# Patient Record
Sex: Female | Born: 1955 | Race: Black or African American | Hispanic: No | Smoking: Never smoker
Health system: Southern US, Community
[De-identification: ages and names within clinical notes are randomized; demographics above are authoritative.]

## PROBLEM LIST (undated history)

## (undated) DIAGNOSIS — E785 Hyperlipidemia, unspecified: Secondary | ICD-10-CM

## (undated) DIAGNOSIS — I10 Essential (primary) hypertension: Secondary | ICD-10-CM

## (undated) DIAGNOSIS — N83209 Unspecified ovarian cyst, unspecified side: Secondary | ICD-10-CM

## (undated) DIAGNOSIS — N6009 Solitary cyst of unspecified breast: Secondary | ICD-10-CM

## (undated) DIAGNOSIS — B019 Varicella without complication: Secondary | ICD-10-CM

## (undated) HISTORY — DX: Varicella without complication: B01.9

## (undated) HISTORY — DX: Unspecified ovarian cyst, unspecified side: N83.209

## (undated) HISTORY — PX: BREAST CYST EXCISION: SHX579

## (undated) HISTORY — DX: Solitary cyst of unspecified breast: N60.09

## (undated) HISTORY — DX: Essential (primary) hypertension: I10

## (undated) HISTORY — DX: Hyperlipidemia, unspecified: E78.5

## (undated) HISTORY — PX: OVARIAN CYST SURGERY: SHX726

---

## 1983-06-25 HISTORY — PX: ABDOMINAL HYSTERECTOMY: SHX81

## 2013-11-25 ENCOUNTER — Encounter: Payer: Self-pay | Admitting: Family

## 2013-11-25 ENCOUNTER — Ambulatory Visit (INDEPENDENT_AMBULATORY_CARE_PROVIDER_SITE_OTHER): Payer: Self-pay | Admitting: Family

## 2013-11-25 VITALS — BP 124/82 | HR 66 | Temp 98.2°F | Resp 16 | Ht 61.0 in | Wt 202.8 lb

## 2013-11-25 DIAGNOSIS — Z0000 Encounter for general adult medical examination without abnormal findings: Secondary | ICD-10-CM

## 2013-11-25 DIAGNOSIS — E785 Hyperlipidemia, unspecified: Secondary | ICD-10-CM | POA: Insufficient documentation

## 2013-11-25 DIAGNOSIS — M545 Low back pain, unspecified: Secondary | ICD-10-CM | POA: Insufficient documentation

## 2013-11-25 DIAGNOSIS — I10 Essential (primary) hypertension: Secondary | ICD-10-CM

## 2013-11-25 MED ORDER — LOSARTAN POTASSIUM-HCTZ 100-25 MG PO TABS: 1.0000 | ORAL_TABLET | Freq: Every day | ORAL | Status: DC

## 2013-11-25 MED ORDER — AMLODIPINE BESYLATE 10 MG PO TABS: 10.0000 mg | ORAL_TABLET | Freq: Every day | ORAL | Status: DC

## 2013-11-25 MED ORDER — LOVASTATIN 20 MG PO TABS: 20.0000 mg | ORAL_TABLET | Freq: Every day | ORAL | Status: DC

## 2013-11-25 NOTE — Progress Notes (Signed)
Pre visit review using our clinic review tool, if applicable. No additional management support is needed unless otherwise documented below in the visit note/SLS  

## 2013-11-25 NOTE — Progress Notes (Signed)
Subjective:    Patient ID: Regina Turner, female    DOB: 06-Feb-1956, 58 y.o.   MRN: [id number]  HPI  Ms. Regina Turner is a 58 yr old female who presents today to establish care. [facility]  1) HTN- maintained on losartan-hctz.  BP Readings from Last 3 Encounters:  11/25/13 124/82  Has been treated since her 30's.  She denies CP/SOB/or swelling.    2) hyperlipidemia- currently maintained on mevacor.   She reports that her cholesterol is well controlled. Denies current myalgia.    3) Some low back pain worse with vacuuming.    Reports last cpx was in December.  Last tetanus shot? Last pap 2013.  Neg pap 4/13.   Review of Systems  Constitutional:       Weight has been up and down.   HENT: Negative for rhinorrhea.   Respiratory: Negative for shortness of breath.        Reports occasional cough which is relieved by "allergy pill."  Cardiovascular: Negative for chest pain.  Gastrointestinal: Negative for nausea, vomiting and diarrhea.  Genitourinary: Negative for dysuria, frequency and hematuria.  Musculoskeletal: Negative for myalgias.  Skin: Negative for rash.  Neurological: Negative for headaches.  Hematological: Negative for adenopathy.  Psychiatric/Behavioral:       Denies depression/anxiety   Past Medical History  Diagnosis Date  . Hypertension   . Hyperlipidemia   . Chicken pox   . Breast cyst   . Cyst of ovary     History   Social History  . Marital Status: Divorced    Spouse Name: N/A    Number of Children: N/A  . Years of Education: N/A   Occupational History  . Not on file.   Social History Main Topics  . Smoking status: Never Smoker   . Smokeless tobacco: Not on file  . Alcohol Use: Not on file  . Drug Use: Not on file  . Sexual Activity: Not on file   Other Topics Concern  . Not on file   Social History Narrative   Divorced   Has worked in school system- quit in 2013   Lives with daughter, [place] another [place]   Completed 12th grade   Enjoys reading    Past Surgical History  Procedure Laterality Date  . Ovarian cyst surgery    . Breast cyst excision    . Abdominal hysterectomy  1985    Family History  Problem Relation Age of Onset  . Kidney disease Mother 3368    Deceased  . Arthritis Mother   . Stroke Mother   . Hypertension Mother   . Emphysema Father     Deceased 5970s  . Heart attack Father   . Cancer Maternal Grandfather   . Alzheimer's disease Maternal Grandmother   . Cancer Paternal Uncle   . Parkinson's disease Sister   . Diabetes Sister   . Stroke Brother   . Hypertension Brother     x2  . Lupus Daughter   . Migraines Daughter   . Hypertension Daughter     x2    Allergies  Allergen Reactions  . Sulfa Antibiotics Nausea And Vomiting    No current outpatient prescriptions on file prior to visit.   No current facility-administered medications on file prior to visit.    BP 124/82  Pulse 66  Temp(Src) 98.2 F (36.8 C) (Oral)  Resp 16  Ht 5\' 1"  (1.549 m)  Wt 202 lb 12 oz (  91.967 kg)  BMI 38.33 kg/m2  SpO2 98%       Objective:   Physical Exam  Constitutional: She is oriented to person, place, and time. She appears well-developed and well-nourished. No distress.  Obese AA female  HENT:  Head: Normocephalic and atraumatic.  Cardiovascular: Normal rate and regular rhythm.   No murmur heard. Pulmonary/Chest: Effort normal and breath sounds normal. No respiratory distress. She has no wheezes. She has no rales. She exhibits no tenderness.  Musculoskeletal: She exhibits no edema.  Lymphadenopathy:    She has no cervical adenopathy.  Neurological: She is alert and oriented to person, place, and time. No cranial nerve deficit.  Skin: Skin is warm and dry.  Psychiatric: She has a normal mood and affect. Her behavior is normal. Judgment and thought content normal.          Assessment & Plan:

## 2013-11-25 NOTE — Assessment & Plan Note (Signed)
Tolerating statin, continue same, obtain FLP/LFT.

## 2013-11-25 NOTE — Patient Instructions (Signed)
Please complete lab work prior to leaving. Follow up in 4 months for physical, sooner if problems/concerns. You may take tylenol as needed for back pain.

## 2013-11-25 NOTE — Assessment & Plan Note (Signed)
BP stable on current meds. Continue same. Obtain follow up bmet.  

## 2013-11-25 NOTE — Assessment & Plan Note (Signed)
Discussed importance of weight loss. 

## 2013-11-26 ENCOUNTER — Telehealth: Payer: Self-pay | Admitting: Family

## 2013-11-26 NOTE — Telephone Encounter (Signed)
Relevant patient education assigned to patient using Emmi. ° °

## 2014-03-29 ENCOUNTER — Ambulatory Visit: Payer: Self-pay | Admitting: Family

## 2014-05-02 ENCOUNTER — Ambulatory Visit: Payer: Self-pay | Admitting: Family

## 2014-05-09 LAB — LIPID PANEL
CHOLESTEROL: 172 mg/dL (ref 0–200)
HDL: 54 mg/dL (ref >39)
LDL Cholesterol: 93 mg/dL (ref 0–99)
TRIGLYCERIDES: 124 mg/dL (ref <150)
Total CHOL/HDL Ratio: 3.2 Ratio
VLDL: 25 mg/dL (ref 0–40)

## 2014-05-09 LAB — BASIC METABOLIC PANEL
BUN: 14 mg/dL (ref 6–23)
CALCIUM: 10.3 mg/dL (ref 8.4–10.5)
CHLORIDE: 100 meq/L (ref 96–112)
CO2: 29 mEq/L (ref 19–32)
CREATININE: 0.78 mg/dL (ref 0.50–1.10)
Glucose, Bld: 107 mg/dL — ABNORMAL HIGH (ref 70–99)
Potassium: 3.5 mEq/L (ref 3.5–5.3)
Sodium: 141 mEq/L (ref 135–145)

## 2014-05-09 LAB — HEPATIC FUNCTION PANEL
ALBUMIN: 4.5 g/dL (ref 3.5–5.2)
ALT: 11 U/L (ref 0–35)
AST: 16 U/L (ref 0–37)
Alkaline Phosphatase: 64 U/L (ref 39–117)
Bilirubin, Direct: 0.1 mg/dL (ref 0.0–0.3)
Indirect Bilirubin: 0.3 mg/dL (ref 0.2–1.2)
TOTAL PROTEIN: 7.3 g/dL (ref 6.0–8.3)
Total Bilirubin: 0.4 mg/dL (ref 0.2–1.2)

## 2014-05-10 ENCOUNTER — Encounter: Payer: Self-pay | Admitting: Family

## 2014-05-13 ENCOUNTER — Encounter: Payer: Self-pay | Admitting: Family

## 2014-05-13 ENCOUNTER — Ambulatory Visit (INDEPENDENT_AMBULATORY_CARE_PROVIDER_SITE_OTHER): Payer: Self-pay | Admitting: Family

## 2014-05-13 VITALS — BP 124/52 | HR 69 | Temp 97.8°F | Resp 16 | Ht 61.0 in | Wt 205.6 lb

## 2014-05-13 DIAGNOSIS — R739 Hyperglycemia, unspecified: Secondary | ICD-10-CM

## 2014-05-13 DIAGNOSIS — I10 Essential (primary) hypertension: Secondary | ICD-10-CM

## 2014-05-13 DIAGNOSIS — E785 Hyperlipidemia, unspecified: Secondary | ICD-10-CM

## 2014-05-13 DIAGNOSIS — Z87898 Personal history of other specified conditions: Secondary | ICD-10-CM

## 2014-05-13 DIAGNOSIS — M545 Low back pain: Secondary | ICD-10-CM

## 2014-05-13 MED ORDER — LOVASTATIN 20 MG PO TABS: 20.0000 mg | ORAL_TABLET | Freq: Every day | ORAL | Status: DC

## 2014-05-13 MED ORDER — AMLODIPINE BESYLATE 10 MG PO TABS: 10.0000 mg | ORAL_TABLET | Freq: Every day | ORAL | Status: DC

## 2014-05-13 MED ORDER — LOSARTAN POTASSIUM-HCTZ 100-25 MG PO TABS: 1.0000 | ORAL_TABLET | Freq: Every day | ORAL | Status: DC

## 2014-05-13 NOTE — Patient Instructions (Addendum)
Please complete lab work at your earliest convenience. Use motrin sparingly as needed for back pain.   Continue current meds.

## 2014-05-13 NOTE — Progress Notes (Signed)
Subjective:    Patient ID: Regina Turner, female    DOB: 08-27-1955, 58 y.o.   MRN: [id number]  HPI  Regina Turner is a 58 yr old female who presents today for follow up.  1) HTN-  maintained on amlodipine, and hyzaar.  BP Readings from Last 3 Encounters:  05/13/14 124/52  11/25/13 124/82    2) Hyperlipidemia-  Maintained on mevacor.   Lab Results  Component Value Date   CHOL 172 05/09/2014   HDL 54 05/09/2014   LDLCALC 93 05/09/2014   TRIG 124 05/09/2014   CHOLHDL 3.2 05/09/2014   3) Low back pain- reports that this has been "off and on." Using motrin prn.   Pt reports that she has never been diagnosed with anemia but if she does not take iron she becomes "tired."   Review of Systems See HPI  Past Medical History  Diagnosis Date  . Hypertension   . Hyperlipidemia   . Chicken pox   . Breast cyst   . Cyst of ovary     History   Social History  . Marital Status: Divorced    Spouse Name: N/A    Number of Children: N/A  . Years of Education: N/A   Occupational History  . Not on file.   Social History Main Topics  . Smoking status: Never Smoker   . Smokeless tobacco: Not on file  . Alcohol Use: Not on file  . Drug Use: Not on file  . Sexual Activity: Not on file   Other Topics Concern  . Not on file   Social History Narrative   Divorced   Has worked in school system- quit in 2013   Lives with daughter, has another daughter [place] in [place]   Completed 12th grade   Enjoys reading    Past Surgical History  Procedure Laterality Date  . Ovarian cyst surgery    . Breast cyst excision    . Abdominal hysterectomy  1985    Family History  Problem Relation Age of Onset  . Kidney disease Mother 6968    Deceased  . Arthritis Mother   . Stroke Mother   . Hypertension Mother   . Emphysema Father     Deceased 1470s  . Heart attack Father   . Cancer Maternal Grandfather   . Alzheimer's disease Maternal Grandmother   . Cancer Paternal Uncle    . Parkinson's disease Sister   . Diabetes Sister   . Stroke Brother   . Hypertension Brother     x2  . Lupus Daughter   . Migraines Daughter   . Hypertension Daughter     x2    Allergies  Allergen Reactions  . Sulfa Antibiotics Nausea And Vomiting    Current Outpatient Prescriptions on File Prior to Visit  Medication Sig Dispense Refill  . amLODipine (NORVASC) 10 MG tablet Take 1 tablet (10 mg total) by mouth at bedtime. 90 tablet 1  . Cholecalciferol 1000 UNITS capsule Take 1,000 Units by mouth daily.    . Ferrous Sulfate (IRON) 325 (65 FE) MG TABS Take 1 tablet by mouth daily. OTC    . losartan-hydrochlorothiazide (HYZAAR) 100-25 MG per tablet Take 1 tablet by mouth daily. 90 tablet 1  . lovastatin (MEVACOR) 20 MG tablet Take 1 tablet (20 mg total) by mouth daily. 90 tablet 1  . Multiple Vitamins-Minerals (CENTRUM SILVER ADULT 50+ PO) Take 1 tablet by mouth daily.     No current facility-administered medications on file  prior to visit.    BP 124/52 mmHg  Pulse 69  Temp(Src) 97.8 F (36.6 C) (Oral)  Resp 16  Ht 5\' 1"  (1.549 m)  Wt 205 lb 9.6 oz (93.26 kg)  BMI 38.87 kg/m2  SpO2 100%       Objective:   Physical Exam  Constitutional: She is oriented to person, place, and time. She appears well-developed and well-nourished. No distress.  HENT:  Head: Normocephalic and atraumatic.  Cardiovascular: Normal rate and regular rhythm.   No murmur heard. Pulmonary/Chest: Effort normal and breath sounds normal. No respiratory distress. She has no wheezes. She has no rales. She exhibits no tenderness.  Musculoskeletal: She exhibits no edema.  Neurological: She is alert and oriented to person, place, and time.  Psychiatric: She has a normal mood and affect. Her behavior is normal. Judgment and thought content normal.          Assessment & Plan:

## 2014-05-13 NOTE — Assessment & Plan Note (Signed)
Will obtain A1C to rule out diabetes.  We did discuss flu shot and she declines.  Pt was given number to the business office to see if she can apply for [facility] pt assistance. We did discuss that she should schedule a complete physical at her earliest convenience.

## 2014-05-13 NOTE — Assessment & Plan Note (Signed)
Advised pt to make sure to only use ibuprofen sparingly.

## 2014-05-13 NOTE — Assessment & Plan Note (Signed)
Stable on mevacor, continue same.

## 2014-05-13 NOTE — Assessment & Plan Note (Addendum)
BP stable on current meds. Continue same.  

## 2014-05-13 NOTE — Progress Notes (Signed)
Pre visit review using our clinic review tool, if applicable. No additional management support is needed unless otherwise documented below in the visit note. 

## 2014-06-08 ENCOUNTER — Ambulatory Visit: Payer: Self-pay | Admitting: Medical

## 2014-11-04 ENCOUNTER — Other Ambulatory Visit: Payer: Self-pay | Admitting: Family

## 2014-11-04 NOTE — Telephone Encounter (Signed)
Lovastatin refill sent to pharmacy. Pt last seen 04/2014. When should pt follow up in the office?

## 2014-11-06 NOTE — Telephone Encounter (Signed)
Patient is due for follow up please arrange.

## 2014-11-07 NOTE — Telephone Encounter (Signed)
Informed patient daughter of med refill and she scheduled appointment for 12/02/14 for patient

## 2014-11-15 ENCOUNTER — Other Ambulatory Visit: Payer: Self-pay | Admitting: Family

## 2014-12-02 ENCOUNTER — Ambulatory Visit: Payer: Self-pay | Admitting: Family

## 2014-12-15 ENCOUNTER — Ambulatory Visit (INDEPENDENT_AMBULATORY_CARE_PROVIDER_SITE_OTHER): Payer: Self-pay | Admitting: Family

## 2014-12-15 ENCOUNTER — Encounter: Payer: Self-pay | Admitting: Family

## 2014-12-15 VITALS — BP 118/80 | HR 78 | Temp 98.2°F | Resp 18 | Ht 61.0 in | Wt 207.0 lb

## 2014-12-15 DIAGNOSIS — E785 Hyperlipidemia, unspecified: Secondary | ICD-10-CM

## 2014-12-15 DIAGNOSIS — D509 Iron deficiency anemia, unspecified: Secondary | ICD-10-CM

## 2014-12-15 DIAGNOSIS — M722 Plantar fascial fibromatosis: Secondary | ICD-10-CM

## 2014-12-15 DIAGNOSIS — I10 Essential (primary) hypertension: Secondary | ICD-10-CM

## 2014-12-15 DIAGNOSIS — D649 Anemia, unspecified: Secondary | ICD-10-CM

## 2014-12-15 DIAGNOSIS — R739 Hyperglycemia, unspecified: Secondary | ICD-10-CM

## 2014-12-15 LAB — CBC WITH DIFFERENTIAL/PLATELET
BASOS PCT: 0.5 % (ref 0.0–3.0)
Basophils Absolute: 0 10*3/uL (ref 0.0–0.1)
EOS PCT: 1 % (ref 0.0–5.0)
Eosinophils Absolute: 0.1 10*3/uL (ref 0.0–0.7)
HEMATOCRIT: 39.5 % (ref 36.0–46.0)
HEMOGLOBIN: 13 g/dL (ref 12.0–15.0)
LYMPHS PCT: 36.6 % (ref 12.0–46.0)
Lymphs Abs: 2.9 10*3/uL (ref 0.7–4.0)
MCHC: 32.9 g/dL (ref 30.0–36.0)
MCV: 86.3 fl (ref 78.0–100.0)
MONOS PCT: 6.5 % (ref 3.0–12.0)
Monocytes Absolute: 0.5 10*3/uL (ref 0.1–1.0)
NEUTROS PCT: 55.4 % (ref 43.0–77.0)
Neutro Abs: 4.4 10*3/uL (ref 1.4–7.7)
PLATELETS: 320 10*3/uL (ref 150.0–400.0)
RBC: 4.58 Mil/uL (ref 3.87–5.11)
RDW: 14.4 % (ref 11.5–15.5)
WBC: 7.9 10*3/uL (ref 4.0–10.5)

## 2014-12-15 LAB — BASIC METABOLIC PANEL
BUN: 20 mg/dL (ref 6–23)
CHLORIDE: 100 meq/L (ref 96–112)
CO2: 29 mEq/L (ref 19–32)
Calcium: 10.6 mg/dL — ABNORMAL HIGH (ref 8.4–10.5)
Creatinine, Ser: 1.04 mg/dL (ref 0.40–1.20)
GFR: 69.59 mL/min (ref >60.00)
Glucose, Bld: 91 mg/dL (ref 70–99)
Potassium: 3 mEq/L — ABNORMAL LOW (ref 3.5–5.1)
Sodium: 137 mEq/L (ref 135–145)

## 2014-12-15 LAB — IRON: Iron: 54 ug/dL (ref 42–145)

## 2014-12-15 LAB — HEMOGLOBIN A1C: Hgb A1c MFr Bld: 6 % (ref 4.6–6.5)

## 2014-12-15 MED ORDER — LOSARTAN POTASSIUM-HCTZ 100-25 MG PO TABS: 1.0000 | ORAL_TABLET | Freq: Every day | ORAL | Status: DC

## 2014-12-15 MED ORDER — LOVASTATIN 20 MG PO TABS: ORAL_TABLET | ORAL | Status: DC

## 2014-12-15 MED ORDER — AMLODIPINE BESYLATE 10 MG PO TABS: ORAL_TABLET | ORAL | Status: DC

## 2014-12-15 NOTE — Assessment & Plan Note (Signed)
Advised pt:  Heel pain is most likely due to plantar fasciitis.  You may use aleve 220mg  (one tab) twice daily for 1 week for right heel pain.  Ice foot twice daily by rolling on a frozen water bottle. Call if symptoms worsen or if symptoms do not improve.

## 2014-12-15 NOTE — Assessment & Plan Note (Signed)
Obtain A1C. 

## 2014-12-15 NOTE — Patient Instructions (Addendum)
Heel pain is most likely due to plantar fasciitis.  You may use aleve 220mg  (one tab) twice daily for 1 week for right heel pain.  Ice foot twice daily by rolling on a frozen water bottle. Call if symptoms worsen or if symptoms do not improve.  Complete lab work prior to leaving.

## 2014-12-15 NOTE — Assessment & Plan Note (Signed)
At goal on mevacor, continue same.

## 2014-12-15 NOTE — Progress Notes (Signed)
Pre visit review using our clinic review tool, if applicable. No additional management support is needed unless otherwise documented below in the visit note. 

## 2014-12-15 NOTE — Assessment & Plan Note (Signed)
Stable on current meds. Continue same. Obtain follow up bmet.

## 2014-12-15 NOTE — Progress Notes (Signed)
Subjective:    Patient ID: Regina Turner, female    DOB: 05-Aug-1955, 59 y.o.   MRN: [id number]  HPI  Regina Turner is a 59 yr old female who presents today for follow up.  1) Hypertension- maintained on hyzaar and amlodipine.  Denies CP or SOB.  BP Readings from Last 3 Encounters:  12/15/14 118/80  05/13/14 124/52  11/25/13 124/82    2) Anemia- continues the iron supplement. Did not complete blood work ordered last visit.    3) Hyperglycemia- last visit glucose was mildly elevated.   4) Right foot pain- occasionally.    5) Hyperlipidemia- on mevacor.  Denies myalgia.  Lab Results  Component Value Date   CHOL 172 05/09/2014   HDL 54 05/09/2014   LDLCALC 93 05/09/2014   TRIG 124 05/09/2014   CHOLHDL 3.2 05/09/2014    Review of Systems See HPI  Past Medical History  Diagnosis Date  . Hypertension   . Hyperlipidemia   . Chicken pox   . Breast cyst   . Cyst of ovary     History   Social History  . Marital Status: Divorced    Spouse Name: N/A  . Number of Children: N/A  . Years of Education: N/A   Occupational History  . Not on file.   Social History Main Topics  . Smoking status: Never Smoker   . Smokeless tobacco: Not on file  . Alcohol Use: Not on file  . Drug Use: Not on file  . Sexual Activity: Not on file   Other Topics Concern  . Not on file   Social History Narrative   Divorced   Has worked in school system- quit in 2013   Lives with daughter, has another daughter who lives in [place]   Completed 12th grade   Enjoys reading    Past Surgical History  Procedure Laterality Date  . Ovarian cyst surgery    . Breast cyst excision    . Abdominal hysterectomy  1985    Family History  Problem Relation Age of Onset  . Kidney disease Mother 58    Deceased  . Arthritis Mother   . Stroke Mother   . Hypertension Mother   . Emphysema Father     Deceased 25s  . Heart attack Father   . Cancer Maternal Grandfather   . Alzheimer's  disease Maternal Grandmother   . Cancer Paternal Uncle   . Parkinson's disease Sister   . Diabetes Sister   . Stroke Brother   . Hypertension Brother     x2  . Lupus Daughter   . Migraines Daughter   . Hypertension Daughter     x2    Allergies  Allergen Reactions  . Sulfa Antibiotics Nausea And Vomiting    Current Outpatient Prescriptions on File Prior to Visit  Medication Sig Dispense Refill  . Cholecalciferol 1000 UNITS capsule Take 2,000 Units by mouth daily.     . Ferrous Sulfate (IRON) 325 (65 FE) MG TABS Take 1 tablet by mouth daily. OTC    . Multiple Vitamins-Minerals (CENTRUM SILVER ADULT 50+ PO) Take 1 tablet by mouth daily.     No current facility-administered medications on file prior to visit.    BP 118/80 mmHg  Pulse 78  Temp(Src) 98.2 F (36.8 C) (Oral)  Resp 18  Ht  (1.549 m)  Wt 207 lb (93.895 kg)  BMI 39.13 kg/m2  SpO2 99%       Objective:   Physical Exam  Constitutional: She appears well-developed and well-nourished.  Cardiovascular: Normal rate, regular rhythm and normal heart sounds.   No murmur heard. Pulmonary/Chest: Effort normal and breath sounds normal. No respiratory distress. She has no wheezes.  Musculoskeletal:  + mild tenderness to palpation of right heel/arch  Psychiatric: She has a normal mood and affect. Her behavior is normal. Judgment and thought content normal.          Assessment & Plan:

## 2014-12-15 NOTE — Assessment & Plan Note (Signed)
Obtain cbc, serum iron, continue iron supplement pending review of labs.

## 2014-12-16 ENCOUNTER — Telehealth: Payer: Self-pay | Admitting: Family

## 2014-12-16 DIAGNOSIS — E876 Hypokalemia: Secondary | ICD-10-CM

## 2014-12-16 MED ORDER — POTASSIUM CHLORIDE ER 10 MEQ PO TBCR: EXTENDED_RELEASE_TABLET | ORAL | Status: DC

## 2014-12-16 NOTE — Telephone Encounter (Signed)
Potassium is low.  Add Kdur 20 meq tabs.  2 tabs PO today, then one tab po daily, repeat bmet in 1 week, dx hypokalemia.

## 2014-12-16 NOTE — Telephone Encounter (Signed)
Advised patient. Sent Rx to pharmacy.  bmet ordered for future.

## 2014-12-29 ENCOUNTER — Telehealth: Payer: Self-pay | Admitting: Family

## 2014-12-29 ENCOUNTER — Other Ambulatory Visit (INDEPENDENT_AMBULATORY_CARE_PROVIDER_SITE_OTHER): Payer: Self-pay

## 2014-12-29 DIAGNOSIS — E876 Hypokalemia: Secondary | ICD-10-CM

## 2014-12-29 LAB — BASIC METABOLIC PANEL
BUN: 15 mg/dL (ref 6–23)
CO2: 31 meq/L (ref 19–32)
Calcium: 10 mg/dL (ref 8.4–10.5)
Chloride: 102 mEq/L (ref 96–112)
Creatinine, Ser: 0.85 mg/dL (ref 0.40–1.20)
GFR: 87.82 mL/min (ref >60.00)
GLUCOSE: 108 mg/dL — AB (ref 70–99)
Potassium: 3.3 mEq/L — ABNORMAL LOW (ref 3.5–5.1)
Sodium: 140 mEq/L (ref 135–145)

## 2014-12-29 MED ORDER — POTASSIUM CHLORIDE CRYS ER 20 MEQ PO TBCR: 20.0000 meq | EXTENDED_RELEASE_TABLET | Freq: Every day | ORAL | Status: DC

## 2014-12-29 NOTE — Telephone Encounter (Signed)
Notified pt and she voices understanding. Lab appt scheduled for 01/05/15 at 8am and future lab order entered.

## 2014-12-29 NOTE — Telephone Encounter (Signed)
K still low. Change Kdur to once daily, repeat bmet in 1 week please dx hypokalemia.

## 2015-01-05 ENCOUNTER — Other Ambulatory Visit (INDEPENDENT_AMBULATORY_CARE_PROVIDER_SITE_OTHER): Payer: Self-pay

## 2015-01-05 ENCOUNTER — Other Ambulatory Visit: Payer: Self-pay

## 2015-01-05 DIAGNOSIS — E876 Hypokalemia: Secondary | ICD-10-CM

## 2015-01-05 LAB — BASIC METABOLIC PANEL
BUN: 15 mg/dL (ref 6–23)
CALCIUM: 10 mg/dL (ref 8.4–10.5)
CHLORIDE: 101 meq/L (ref 96–112)
CO2: 31 mEq/L (ref 19–32)
Creatinine, Ser: 0.94 mg/dL (ref 0.40–1.20)
GFR: 78.18 mL/min (ref >60.00)
Glucose, Bld: 104 mg/dL — ABNORMAL HIGH (ref 70–99)
Potassium: 3.7 mEq/L (ref 3.5–5.1)
SODIUM: 138 meq/L (ref 135–145)

## 2015-01-08 ENCOUNTER — Encounter: Payer: Self-pay | Admitting: Family

## 2015-02-06 ENCOUNTER — Other Ambulatory Visit: Payer: Self-pay | Admitting: Family

## 2015-03-13 ENCOUNTER — Other Ambulatory Visit: Payer: Self-pay | Admitting: Family

## 2015-06-09 ENCOUNTER — Ambulatory Visit (INDEPENDENT_AMBULATORY_CARE_PROVIDER_SITE_OTHER): Payer: Self-pay | Admitting: Family

## 2015-06-09 ENCOUNTER — Encounter: Payer: Self-pay | Admitting: Family

## 2015-06-09 ENCOUNTER — Telehealth: Payer: Self-pay | Admitting: Family

## 2015-06-09 VITALS — BP 133/54 | HR 66 | Temp 98.1°F | Resp 18 | Ht 61.0 in | Wt 205.0 lb

## 2015-06-09 DIAGNOSIS — I10 Essential (primary) hypertension: Secondary | ICD-10-CM

## 2015-06-09 DIAGNOSIS — E785 Hyperlipidemia, unspecified: Secondary | ICD-10-CM

## 2015-06-09 DIAGNOSIS — R739 Hyperglycemia, unspecified: Secondary | ICD-10-CM

## 2015-06-09 DIAGNOSIS — E876 Hypokalemia: Secondary | ICD-10-CM

## 2015-06-09 DIAGNOSIS — D508 Other iron deficiency anemias: Secondary | ICD-10-CM

## 2015-06-09 LAB — CBC WITH DIFFERENTIAL/PLATELET
BASOS ABS: 0 10*3/uL (ref 0.0–0.1)
Basophils Relative: 0.5 % (ref 0.0–3.0)
Eosinophils Absolute: 0.1 10*3/uL (ref 0.0–0.7)
Eosinophils Relative: 1.1 % (ref 0.0–5.0)
HEMATOCRIT: 40 % (ref 36.0–46.0)
HEMOGLOBIN: 13.1 g/dL (ref 12.0–15.0)
LYMPHS PCT: 37.9 % (ref 12.0–46.0)
Lymphs Abs: 3.1 10*3/uL (ref 0.7–4.0)
MCHC: 32.6 g/dL (ref 30.0–36.0)
MCV: 86.7 fl (ref 78.0–100.0)
MONOS PCT: 7.1 % (ref 3.0–12.0)
Monocytes Absolute: 0.6 10*3/uL (ref 0.1–1.0)
NEUTROS ABS: 4.4 10*3/uL (ref 1.4–7.7)
Neutrophils Relative %: 53.4 % (ref 43.0–77.0)
PLATELETS: 335 10*3/uL (ref 150.0–400.0)
RBC: 4.62 Mil/uL (ref 3.87–5.11)
RDW: 14.3 % (ref 11.5–15.5)
WBC: 8.2 10*3/uL (ref 4.0–10.5)

## 2015-06-09 LAB — BASIC METABOLIC PANEL
BUN: 12 mg/dL (ref 6–23)
CALCIUM: 10.8 mg/dL — AB (ref 8.4–10.5)
CO2: 34 meq/L — AB (ref 19–32)
Chloride: 99 mEq/L (ref 96–112)
Creatinine, Ser: 0.89 mg/dL (ref 0.40–1.20)
GFR: 83.15 mL/min (ref >60.00)
Glucose, Bld: 89 mg/dL (ref 70–99)
Potassium: 3.4 mEq/L — ABNORMAL LOW (ref 3.5–5.1)
SODIUM: 140 meq/L (ref 135–145)

## 2015-06-09 LAB — IRON: Iron: 76 ug/dL (ref 42–145)

## 2015-06-09 LAB — LIPID PANEL
CHOL/HDL RATIO: 3
Cholesterol: 152 mg/dL (ref 0–200)
HDL: 49.8 mg/dL (ref >39.00)
LDL CALC: 82 mg/dL (ref 0–99)
NONHDL: 101.71
Triglycerides: 101 mg/dL (ref 0.0–149.0)
VLDL: 20.2 mg/dL (ref 0.0–40.0)

## 2015-06-09 MED ORDER — AMLODIPINE BESYLATE 10 MG PO TABS: ORAL_TABLET | ORAL | Status: DC

## 2015-06-09 MED ORDER — LOVASTATIN 20 MG PO TABS: ORAL_TABLET | ORAL | Status: DC

## 2015-06-09 MED ORDER — LOSARTAN POTASSIUM-HCTZ 100-25 MG PO TABS: 1.0000 | ORAL_TABLET | Freq: Every day | ORAL | Status: DC

## 2015-06-09 MED ORDER — POTASSIUM CHLORIDE CRYS ER 10 MEQ PO TBCR: 10.0000 meq | EXTENDED_RELEASE_TABLET | Freq: Two times a day (BID) | ORAL | Status: DC

## 2015-06-09 NOTE — Patient Instructions (Signed)
Please complete lab work prior to leaving.   

## 2015-06-09 NOTE — Telephone Encounter (Signed)
Calcium was also high.  Lets also include ionized calcium and PTH in her blood draw dx hypercalcemia. Stop tums.

## 2015-06-09 NOTE — Progress Notes (Signed)
Pre visit review using our clinic review tool, if applicable. No additional management support is needed unless otherwise documented below in the visit note. 

## 2015-06-09 NOTE — Assessment & Plan Note (Signed)
Obtain lipid panel. Continue mevacor.

## 2015-06-09 NOTE — Telephone Encounter (Signed)
Notified pt and she voices understanding. She will have to make travel arrangements with her daughter. Future lab orders entered and lab appt scheduled for 06/15/15 at 10am. Sent email to inhouse billling to determine pt estimate for bmet in our office and will call [facility] Monday (client billing) to obtain cost of PTH w/calcium.

## 2015-06-09 NOTE — Assessment & Plan Note (Signed)
Last A1C was 6.0.  We discussed diabetic diet, exercise.  Obtian follow up A1C

## 2015-06-09 NOTE — Progress Notes (Signed)
Subjective:    Patient ID: Regina Turner, female    DOB: [birth year], 59 y.o.   MRN: [id number]  HPI  HTN-  maintained on amlodipine, hyzaar. BP Readings from Last 3 Encounters:  06/09/15 133/54  12/15/14 118/80  05/13/14 124/52   Hyperlipidemia- on mevacor.  Lab Results  Component Value Date   CHOL 172 05/09/2014   HDL 54 05/09/2014   LDLCALC 93 05/09/2014   TRIG 124 05/09/2014   CHOLHDL 3.2 05/09/2014   Anemia-  On iron.  Denies constipatoin Lab Results  Component Value Date   WBC 7.9 12/15/2014   HGB 13.0 12/15/2014   HCT 39.5 12/15/2014   MCV 86.3 12/15/2014   PLT 320.0 12/15/2014   Wt Readings from Last 3 Encounters:  06/09/15 205 lb (92.987 kg)  12/15/14 207 lb (93.895 kg)  05/13/14 205 lb 9.6 oz (93.26 kg)     Review of Systems  Respiratory: Negative for shortness of breath.   Cardiovascular: Negative for chest pain and leg swelling.  Musculoskeletal: Negative for myalgias.   See HPI  Past Medical History  Diagnosis Date  . Hypertension   . Hyperlipidemia   . Chicken pox   . Breast cyst   . Cyst of ovary     Social History   Social History  . Marital Status: Divorced    Spouse Name: N/A  . Number of Children: N/A  . Years of Education: N/A   Occupational History  . Not on file.   Social History Main Topics  . Smoking status: Never Smoker   . Smokeless tobacco: Not on file  . Alcohol Use: Not on file  . Drug Use: Not on file  . Sexual Activity: Not on file   Other Topics Concern  . Not on file   Social History Narrative   Divorced   Has worked in school system- quit in 2013   Lives with daughter, has another daughter who lives in [place]   Completed 12th grade   Enjoys reading    Past Surgical History  Procedure Laterality Date  . Ovarian cyst surgery    . Breast cyst excision    . Abdominal hysterectomy  1985    Family History  Problem Relation Age of Onset  . Kidney disease Mother 67    Deceased  . Arthritis  Mother   . Stroke Mother   . Hypertension Mother   . Emphysema Father     Deceased 40s  . Heart attack Father   . Cancer Maternal Grandfather   . Alzheimer's disease Maternal Grandmother   . Cancer Paternal Uncle   . Parkinson's disease Sister   . Diabetes Sister   . Stroke Brother   . Hypertension Brother     x2  . Lupus Daughter   . Migraines Daughter   . Hypertension Daughter     x2    Allergies  Allergen Reactions  . Sulfa Antibiotics Nausea And Vomiting    Current Outpatient Prescriptions on File Prior to Visit  Medication Sig Dispense Refill  . amLODipine (NORVASC) 10 MG tablet TAKE 1 TABLET (10 MG TOTAL) BY MOUTH AT BEDTIME. 90 tablet 1  . aspirin EC 81 MG tablet Take 81 mg by mouth daily.    . calcium carbonate (TUMS) 500 MG chewable tablet Chew 1 tablet by mouth as needed for indigestion or heartburn.    . Cholecalciferol 1000 UNITS capsule Take 2,000 Units by mouth daily.     . Ferrous Sulfate (IRON) 325 (65  FE) MG TABS Take 1 tablet by mouth daily. OTC    . losartan-hydrochlorothiazide (HYZAAR) 100-25 MG per tablet Take 1 tablet by mouth daily. 90 tablet 1  . lovastatin (MEVACOR) 20 MG tablet TAKE 1 TABLET (20 MG TOTAL) BY MOUTH DAILY. 90 tablet 1  . Multiple Vitamins-Minerals (CENTRUM SILVER ADULT 50+ PO) Take 1 tablet by mouth daily.    . potassium chloride SA (K-DUR,KLOR-CON) 20 MEQ tablet Take 1 tablet (20 mEq total) by mouth daily. (Patient taking differently: Take 20 mEq by mouth daily. Pt takes 1/2 tablet daily.) 30 tablet 3   No current facility-administered medications on file prior to visit.    BP 133/54 mmHg  Pulse 66  Temp(Src) 98.1 F (36.7 C) (Oral)  Resp 18  Ht 5\' 1"  (1.549 m)  Wt 205 lb (92.987 kg)  BMI 38.75 kg/m2  SpO2 100%       Objective:   Physical Exam  Constitutional: She is oriented to person, place, and time. She appears well-developed and well-nourished.  HENT:  Head: Normocephalic and atraumatic.  Cardiovascular: Normal  rate, regular rhythm and normal heart sounds.   No murmur heard. Pulmonary/Chest: Effort normal and breath sounds normal. No respiratory distress. She has no wheezes.  Neurological: She is alert and oriented to person, place, and time.  Psychiatric: She has a normal mood and affect. Her behavior is normal. Judgment and thought content normal.          Assessment & Plan:

## 2015-06-09 NOTE — Telephone Encounter (Signed)
Potassium is low.  Please let pt know that I sent rx for Kdur 10 mEQ one tab twice daily. Repeat bmet in 1 week, dx hypokalemia.

## 2015-06-09 NOTE — Assessment & Plan Note (Signed)
bp stable on current meds.  Obtain bmet (pt is only taking ).  Will see if this dose is sufficient.

## 2015-06-09 NOTE — Assessment & Plan Note (Signed)
Obtain follow up iron, cbc

## 2015-06-14 NOTE — Telephone Encounter (Signed)
Notified pt's daughter, Natalia LeatherwoodKatherine at pt's request of below information. She states that she did pick up the potassium supplement and pt has started it but she is not going to repeat the potassium level at this time as she feels it is not low enough to be of concern and cost is an issue. She will proceed with lab appt for PTH w/Calcium. I have cancelled the bmet.  Please advise?  bmet cost will be $36 and PTH cost with [facility] may be $163. Pt can call [phone] (pt customer service to [facility]).

## 2015-06-15 ENCOUNTER — Other Ambulatory Visit (INDEPENDENT_AMBULATORY_CARE_PROVIDER_SITE_OTHER): Payer: Self-pay

## 2015-06-15 DIAGNOSIS — R739 Hyperglycemia, unspecified: Secondary | ICD-10-CM

## 2015-06-15 LAB — HEMOGLOBIN A1C: Hgb A1c MFr Bld: 6 % (ref 4.6–6.5)

## 2015-06-16 ENCOUNTER — Encounter: Payer: Self-pay | Admitting: Family

## 2015-06-16 LAB — PTH, INTACT AND CALCIUM
Calcium: 10.2 mg/dL (ref 8.4–10.5)
PTH: 56 pg/mL (ref 14–64)

## 2015-06-21 ENCOUNTER — Telehealth: Payer: Self-pay | Admitting: Family

## 2015-06-21 NOTE — Telephone Encounter (Signed)
Relation to pt: self Call back number: [phone]   Reason for call:  Patient inquiring about lab results and would like for you to call daughter because she can explain the results, patient states please call and leave message if necessary best #  [phone]

## 2015-06-21 NOTE — Telephone Encounter (Signed)
Results not reviewed and resulted by Melissa.

## 2015-06-27 NOTE — Telephone Encounter (Signed)
LMOVM for pt's daughter to return call.             

## 2015-06-30 NOTE — Telephone Encounter (Signed)
Notified pt's daughter and they have now received lab letter and have no further questions.

## 2015-08-02 ENCOUNTER — Telehealth: Payer: Self-pay | Admitting: Behavioral Health

## 2015-08-02 NOTE — Telephone Encounter (Signed)
The following care gaps were assessed: Mammogram  Pap (past history of hysterectomy) Colon   Patient declined to have a mammogram and colonoscopy completed.

## 2015-10-02 ENCOUNTER — Other Ambulatory Visit: Payer: Self-pay | Admitting: Family

## 2015-12-14 ENCOUNTER — Encounter: Payer: Self-pay | Admitting: Family

## 2015-12-14 ENCOUNTER — Ambulatory Visit (INDEPENDENT_AMBULATORY_CARE_PROVIDER_SITE_OTHER): Payer: Self-pay | Admitting: Family

## 2015-12-14 VITALS — BP 118/80 | HR 67 | Temp 97.9°F | Resp 16 | Ht 61.0 in | Wt 208.4 lb

## 2015-12-14 DIAGNOSIS — I10 Essential (primary) hypertension: Secondary | ICD-10-CM

## 2015-12-14 DIAGNOSIS — E785 Hyperlipidemia, unspecified: Secondary | ICD-10-CM

## 2015-12-14 DIAGNOSIS — D649 Anemia, unspecified: Secondary | ICD-10-CM

## 2015-12-14 DIAGNOSIS — R739 Hyperglycemia, unspecified: Secondary | ICD-10-CM

## 2015-12-14 LAB — HEMOGLOBIN A1C: HEMOGLOBIN A1C: 6.1 % (ref 4.6–6.5)

## 2015-12-14 LAB — IRON: Iron: 62 ug/dL (ref 42–145)

## 2015-12-14 LAB — BASIC METABOLIC PANEL
BUN: 23 mg/dL (ref 6–23)
CALCIUM: 10.5 mg/dL (ref 8.4–10.5)
CO2: 33 meq/L — AB (ref 19–32)
CREATININE: 0.91 mg/dL (ref 0.40–1.20)
Chloride: 101 mEq/L (ref 96–112)
GFR: 80.91 mL/min (ref >60.00)
GLUCOSE: 93 mg/dL (ref 70–99)
Potassium: 3.7 mEq/L (ref 3.5–5.1)
SODIUM: 139 meq/L (ref 135–145)

## 2015-12-14 MED ORDER — LOSARTAN POTASSIUM-HCTZ 100-25 MG PO TABS: 1.0000 | ORAL_TABLET | Freq: Every day | ORAL | Status: DC

## 2015-12-14 MED ORDER — LOVASTATIN 20 MG PO TABS: ORAL_TABLET | ORAL | Status: DC

## 2015-12-14 MED ORDER — AMLODIPINE BESYLATE 10 MG PO TABS: ORAL_TABLET | ORAL | Status: DC

## 2015-12-14 NOTE — Assessment & Plan Note (Signed)
BP controlled on current meds.

## 2015-12-14 NOTE — Progress Notes (Signed)
Pre visit review using our clinic review tool, if applicable. No additional management support is needed unless otherwise documented below in the visit note. 

## 2015-12-14 NOTE — Progress Notes (Signed)
Subjective:    Patient ID: Regina Turner, female    DOB: 12-12-1955, 60 y.o.   MRN: [id number]  HPI   Regina Turner is a 60 yr old female who presents today for follow up.  1) Hypertension- maintained on hyzaar.    BP Readings from Last 3 Encounters:  12/14/15 118/80  06/09/15 133/54  12/15/14 118/80   2) Hyperlipidemia- maintained on mevacor.  Lab Results  Component Value Date   CHOL 152 06/09/2015   HDL 49.80 06/09/2015   LDLCALC 82 06/09/2015   TRIG 101.0 06/09/2015   CHOLHDL 3 06/09/2015   3) Anemia-  Lab Results  Component Value Date   WBC 8.2 06/09/2015   HGB 13.1 06/09/2015   HCT 40.0 06/09/2015   MCV 86.7 06/09/2015   PLT 335.0 06/09/2015   4) hyperglycemia- on diet only.  Lab Results  Component Value Date   HGBA1C 6.0 06/15/2015       Review of Systems  Respiratory: Negative for shortness of breath.   Cardiovascular: Negative for chest pain.       Occasional LE edema after long day  Musculoskeletal: Negative for myalgias.       Past Medical History  Diagnosis Date  . Hypertension   . Hyperlipidemia   . Chicken pox   . Breast cyst   . Cyst of ovary      Social History   Social History  . Marital Status: Divorced    Spouse Name: N/A  . Number of Children: N/A  . Years of Education: N/A   Occupational History  . Not on file.   Social History Main Topics  . Smoking status: Never Smoker   . Smokeless tobacco: Not on file  . Alcohol Use: Not on file  . Drug Use: Not on file  . Sexual Activity: Not on file   Other Topics Concern  . Not on file   Social History Narrative   Divorced   Has worked in school system- quit in 2013   Lives with daughter, has another daughter who lives in [place]   Completed 12th grade   Enjoys reading    Past Surgical History  Procedure Laterality Date  . Ovarian cyst surgery    . Breast cyst excision    . Abdominal hysterectomy  1985    Family History  Problem Relation Age of Onset    . Kidney disease Mother 3868    Deceased  . Arthritis Mother   . Stroke Mother   . Hypertension Mother   . Emphysema Father     Deceased 4670s  . Heart attack Father   . Cancer Maternal Grandfather   . Alzheimer's disease Maternal Grandmother   . Cancer Paternal Uncle   . Parkinson's disease Sister   . Diabetes Sister   . Stroke Brother   . Hypertension Brother     x2  . Lupus Daughter   . Migraines Daughter   . Hypertension Daughter     x2    Allergies  Allergen Reactions  . Sulfa Antibiotics Nausea And Vomiting    Current Outpatient Prescriptions on File Prior to Visit  Medication Sig Dispense Refill  . aspirin EC 81 MG tablet Take 81 mg by mouth daily.    . calcium carbonate (TUMS) 500 MG chewable tablet Chew 1 tablet by mouth as needed for indigestion or heartburn.    . Ferrous Sulfate (IRON) 325 (65 FE) MG TABS Take 1 tablet by mouth daily. OTC    .  Multiple Vitamins-Minerals (CENTRUM SILVER ADULT 50+ PO) Take 1 tablet by mouth daily.     No current facility-administered medications on file prior to visit.    BP 118/80 mmHg  Pulse 67  Temp(Src) 97.9 F (36.6 C) (Oral)  Resp 16  Ht 5\' 1"  (1.549 m)  Wt 208 lb 6.4 oz (94.53 kg)  BMI 39.40 kg/m2  SpO2 99%    Objective:   Physical Exam  Constitutional: She appears well-developed and well-nourished.  Cardiovascular: Normal rate, regular rhythm and normal heart sounds.   No murmur heard. Pulmonary/Chest: Effort normal and breath sounds normal. No respiratory distress. She has no wheezes.  Psychiatric: She has a normal mood and affect. Her behavior is normal. Judgment and thought content normal.          Assessment & Plan:  Pt is uninsured currently and cannot afford a mammogram.  Daughter states that her colo was done at age 60 and was normal.  She is s/p hysterectomy.

## 2015-12-14 NOTE — Patient Instructions (Addendum)
Please complete lab work prior to leaving. Please contact the [facility] office to inquire about applying for [facility] Patient assistance program since you are uninsured. Their number is:   [phone]

## 2015-12-14 NOTE — Assessment & Plan Note (Signed)
Tolerating statin, lipids at goal.

## 2015-12-14 NOTE — Assessment & Plan Note (Signed)
Clinically stable, obtain A1C.  

## 2015-12-14 NOTE — Assessment & Plan Note (Signed)
Obtain follow up iron level.

## 2015-12-17 ENCOUNTER — Encounter: Payer: Self-pay | Admitting: Family

## 2016-05-24 ENCOUNTER — Other Ambulatory Visit: Payer: Self-pay | Admitting: Family

## 2016-05-24 NOTE — Telephone Encounter (Signed)
Refill sent per [facility] refill protocol/SLS  

## 2016-06-10 ENCOUNTER — Ambulatory Visit: Payer: Self-pay | Admitting: Family

## 2016-08-22 ENCOUNTER — Other Ambulatory Visit: Payer: Self-pay | Admitting: Family

## 2016-08-26 ENCOUNTER — Encounter: Payer: Self-pay | Admitting: Family

## 2016-08-26 ENCOUNTER — Telehealth: Payer: Self-pay | Admitting: Family

## 2016-08-26 ENCOUNTER — Ambulatory Visit (INDEPENDENT_AMBULATORY_CARE_PROVIDER_SITE_OTHER): Payer: Self-pay | Admitting: Family

## 2016-08-26 ENCOUNTER — Other Ambulatory Visit: Payer: Self-pay | Admitting: Family

## 2016-08-26 VITALS — BP 124/61 | HR 74 | Temp 98.6°F | Resp 17 | Ht 61.0 in | Wt 205.2 lb

## 2016-08-26 DIAGNOSIS — E785 Hyperlipidemia, unspecified: Secondary | ICD-10-CM

## 2016-08-26 DIAGNOSIS — E876 Hypokalemia: Secondary | ICD-10-CM

## 2016-08-26 DIAGNOSIS — I10 Essential (primary) hypertension: Secondary | ICD-10-CM

## 2016-08-26 DIAGNOSIS — R739 Hyperglycemia, unspecified: Secondary | ICD-10-CM

## 2016-08-26 DIAGNOSIS — D649 Anemia, unspecified: Secondary | ICD-10-CM

## 2016-08-26 LAB — CBC WITH DIFFERENTIAL/PLATELET
BASOS PCT: 0.5 % (ref 0.0–3.0)
Basophils Absolute: 0 10*3/uL (ref 0.0–0.1)
EOS PCT: 1.4 % (ref 0.0–5.0)
Eosinophils Absolute: 0.1 10*3/uL (ref 0.0–0.7)
HEMATOCRIT: 39 % (ref 36.0–46.0)
Hemoglobin: 12.8 g/dL (ref 12.0–15.0)
Lymphocytes Relative: 42.6 % (ref 12.0–46.0)
Lymphs Abs: 2.8 10*3/uL (ref 0.7–4.0)
MCHC: 32.9 g/dL (ref 30.0–36.0)
MCV: 86.7 fl (ref 78.0–100.0)
MONO ABS: 0.5 10*3/uL (ref 0.1–1.0)
Monocytes Relative: 7.8 % (ref 3.0–12.0)
Neutro Abs: 3.1 10*3/uL (ref 1.4–7.7)
Neutrophils Relative %: 47.7 % (ref 43.0–77.0)
Platelets: 316 10*3/uL (ref 150.0–400.0)
RBC: 4.5 Mil/uL (ref 3.87–5.11)
RDW: 14.2 % (ref 11.5–15.5)
WBC: 6.5 10*3/uL (ref 4.0–10.5)

## 2016-08-26 LAB — BASIC METABOLIC PANEL
BUN: 15 mg/dL (ref 6–23)
CO2: 31 mEq/L (ref 19–32)
Calcium: 10.3 mg/dL (ref 8.4–10.5)
Chloride: 100 mEq/L (ref 96–112)
Creatinine, Ser: 0.88 mg/dL (ref 0.40–1.20)
GFR: 83.9 mL/min (ref >60.00)
Glucose, Bld: 99 mg/dL (ref 70–99)
POTASSIUM: 3.3 meq/L — AB (ref 3.5–5.1)
SODIUM: 139 meq/L (ref 135–145)

## 2016-08-26 LAB — IRON: IRON: 79 ug/dL (ref 42–145)

## 2016-08-26 LAB — HEMOGLOBIN A1C: Hgb A1c MFr Bld: 6.2 % (ref 4.6–6.5)

## 2016-08-26 MED ORDER — LOVASTATIN 20 MG PO TABS: 20.0000 mg | ORAL_TABLET | Freq: Every day | ORAL | 1 refills | Status: DC

## 2016-08-26 MED ORDER — LOSARTAN POTASSIUM-HCTZ 100-25 MG PO TABS: 1.0000 | ORAL_TABLET | Freq: Every day | ORAL | 1 refills | Status: DC

## 2016-08-26 MED ORDER — AMLODIPINE BESYLATE 10 MG PO TABS: 10.0000 mg | ORAL_TABLET | Freq: Every day | ORAL | 1 refills | Status: DC

## 2016-08-26 NOTE — Assessment & Plan Note (Signed)
Obtain cbc serum iron. She is wondering if she needs to continue iron supplement.

## 2016-08-26 NOTE — Progress Notes (Signed)
Subjective:    Patient ID: Regina Turner, female    DOB: 1955/09/17, 61 y.o.   MRN: [id number]  HPI  [name] is a 61 yr old female who presents today for follow up.  1) Hyperlipidemia- maintained on mevacor.  2) HTN- maintianed on losartan hctz and amlodipine.  Denies CP/SOB or swelling. BP Readings from Last 3 Encounters:  08/26/16 124/61  12/14/15 118/80  06/09/15 (!) 133/54    3) Anemia-  Lab Results  Component Value Date   WBC 8.2 06/09/2015   HGB 13.1 06/09/2015   HCT 40.0 06/09/2015   MCV 86.7 06/09/2015   PLT 335.0 06/09/2015   4) hyperglycemia-  Lab Results  Component Value Date   HGBA1C 6.1 12/14/2015      Review of Systems Notes some aching in her legs.  Has some tightening in the muscles in her legs. Only bothers her some days.     Past Medical History:  Diagnosis Date  . Breast cyst   . Chicken pox   . Cyst of ovary   . Hyperlipidemia   . Hypertension      Social History   Social History  . Marital status: Divorced    Spouse name: N/A  . Number of children: N/A  . Years of education: N/A   Occupational History  . Not on file.   Social History Main Topics  . Smoking status: Never Smoker  . Smokeless tobacco: Never Used  . Alcohol use Not on file  . Drug use: Unknown  . Sexual activity: Not on file   Other Topics Concern  . Not on file   Social History Narrative   Divorced   Has worked in school system- quit in 2013   Lives with daughter, has another daughter who lives in [place]   Completed 12th grade   Enjoys reading    Past Surgical History:  Procedure Laterality Date  . ABDOMINAL HYSTERECTOMY  1985  . BREAST CYST EXCISION    . OVARIAN CYST SURGERY      Family History  Problem Relation Age of Onset  . Kidney disease Mother 76    Deceased  . Arthritis Mother   . Stroke Mother   . Hypertension Mother   . Emphysema Father     Deceased 76s  . Heart attack Father   . Cancer Maternal Grandfather   .  Alzheimer's disease Maternal Grandmother   . Cancer Paternal Uncle   . Parkinson's disease Sister   . Diabetes Sister   . Stroke Brother   . Hypertension Brother     x2  . Lupus Daughter   . Migraines Daughter   . Hypertension Daughter     x2    Allergies  Allergen Reactions  . Sulfa Antibiotics Nausea And Vomiting    Current Outpatient Prescriptions on File Prior to Visit  Medication Sig Dispense Refill  . aspirin EC 81 MG tablet Take 81 mg by mouth daily.    . calcium carbonate (TUMS) 500 MG chewable tablet Chew 1 tablet by mouth as needed for indigestion or heartburn.    . Ferrous Sulfate (IRON) 325 (65 FE) MG TABS Take 1 tablet by mouth daily. OTC    . Multiple Vitamins-Minerals (CENTRUM SILVER ADULT 50+ PO) Take 1 tablet by mouth daily.     No current facility-administered medications on file prior to visit.     BP 124/61 (BP Location: Right Arm, Patient Position: Sitting, Cuff Size: Large)   Pulse 74  Temp 98.6 F (37 C) (Oral)   Resp 17   Ht 5\' 1"  (1.549 m)   Wt 205 lb 3.2 oz (93.1 kg)   SpO2 100%   BMI 38.77 kg/m    Objective:   Physical Exam  Constitutional: She is oriented to person, place, and time. She appears well-developed and well-nourished.  HENT:  Head: Normocephalic and atraumatic.  Cardiovascular: Normal rate, regular rhythm and normal heart sounds.   No murmur heard. Pulses:      Dorsalis pedis pulses are 2+ on the right side, and 2+ on the left side.  Pulmonary/Chest: Effort normal and breath sounds normal. No respiratory distress. She has no wheezes.  Musculoskeletal: She exhibits no edema.  Neurological: She is alert and oriented to person, place, and time.  Psychiatric: She has a normal mood and affect. Her behavior is normal. Judgment and thought content normal.          Assessment & Plan:  She is uninsured but interested in mammogram. Advised her to contact the breast center to apply for a mammogram "scholarship."  Also, she  cannot afford colonoscopy but would like to look into cologuard. Will see if the company has any patient assistance options to help her complete.

## 2016-08-26 NOTE — Assessment & Plan Note (Signed)
Stable on mevacor, continue same.  

## 2016-08-26 NOTE — Telephone Encounter (Signed)
Patient is uninsured and interested in pursuing cologuard testing. Could you please contact the company and see if they have anything that they can offer to help uninsured patients get the testing done?

## 2016-08-26 NOTE — Assessment & Plan Note (Signed)
bp stable conitnue current meds. Obtain follow up bmet.

## 2016-08-26 NOTE — Assessment & Plan Note (Signed)
Obtain A1C. 

## 2016-08-26 NOTE — Telephone Encounter (Signed)
K+ is low. Add kdur once daily, repeat bmet in 1 week. Sugar is at goal. Iron level is normal. OK to continue iron supplement.  Kidney function and blood count look good.

## 2016-08-26 NOTE — Patient Instructions (Addendum)
Please complete lab work prior to leaving. Please call the Breast Center [phone] and inquire about the free scholarship for mammogram.

## 2016-08-26 NOTE — Telephone Encounter (Signed)
Regina Turner know you do these on line and was not sure how you wanted to proceed with this one.

## 2016-08-26 NOTE — Progress Notes (Signed)
Pre visit review using our clinic review tool, if applicable. No additional management support is needed unless otherwise documented below in the visit note. 

## 2016-08-27 NOTE — Telephone Encounter (Addendum)
Left message on [place] (Cologuard Rep) voicemail at [phone], to call re: below.

## 2016-08-27 NOTE — Telephone Encounter (Signed)
Notified pt and she voices understanding. States that she is unable to take large pills (got choked on one in the past). Can we send liquid form of potassium?  Scheduled pt for repeat labs on 09/03/16 at 9:45am and future order signed.

## 2016-08-28 ENCOUNTER — Telehealth: Payer: Self-pay | Admitting: Family

## 2016-08-28 MED ORDER — POTASSIUM CHLORIDE 20 MEQ/15ML (10%) PO SOLN: 20.0000 meq | Freq: Every day | ORAL | 5 refills | Status: DC

## 2016-08-28 NOTE — Telephone Encounter (Signed)
See 08/26/16 refill note.

## 2016-08-28 NOTE — Telephone Encounter (Signed)
Caller name: Relationship to patient: Self Can be reached: [phone]  Pharmacy:  [facility] [address] [place], [place] - [phone] (Phone) [phone] (Fax)     Reason for call: Patient states she is supposed to have a Rx for Potassium pills called in to the pharmacy but they have not been called in. Plse let patient know when they have been.

## 2016-08-28 NOTE — Telephone Encounter (Signed)
rx sent for liquid kdur.

## 2016-08-30 MED ORDER — POTASSIUM CHLORIDE ER 10 MEQ PO TBCR: 20.0000 meq | EXTENDED_RELEASE_TABLET | Freq: Every day | ORAL | 5 refills | Status: DC

## 2016-08-30 NOTE — Telephone Encounter (Addendum)
Left message for Rep, [place] to return my call.

## 2016-08-30 NOTE — Telephone Encounter (Signed)
Notified pt and she will let us know if she decides to proceed with cologuard. I will wait to place order until we hear back from her.

## 2016-08-30 NOTE — Telephone Encounter (Signed)
Received call from [place]. She states that self pay price [phone]. If pt is able to pay that amount they will give a $100 cash pay discount and total would be $549. If pt is unable to pay total amount they may call [phone] to ask about setting up a payment plan.  Attempted to reach pt and left message to return my call.

## 2016-09-03 ENCOUNTER — Other Ambulatory Visit: Payer: Self-pay

## 2016-09-06 ENCOUNTER — Telehealth: Payer: Self-pay | Admitting: Family

## 2016-09-06 ENCOUNTER — Other Ambulatory Visit: Payer: Self-pay

## 2016-09-06 ENCOUNTER — Other Ambulatory Visit (INDEPENDENT_AMBULATORY_CARE_PROVIDER_SITE_OTHER): Payer: Self-pay

## 2016-09-06 DIAGNOSIS — E876 Hypokalemia: Secondary | ICD-10-CM

## 2016-09-06 LAB — BASIC METABOLIC PANEL
BUN: 16 mg/dL (ref 6–23)
CHLORIDE: 100 meq/L (ref 96–112)
CO2: 30 mEq/L (ref 19–32)
Calcium: 10.5 mg/dL (ref 8.4–10.5)
Creatinine, Ser: 0.86 mg/dL (ref 0.40–1.20)
GFR: 86.15 mL/min (ref >60.00)
Glucose, Bld: 79 mg/dL (ref 70–99)
POTASSIUM: 3.6 meq/L (ref 3.5–5.1)
Sodium: 136 mEq/L (ref 135–145)

## 2016-09-06 MED ORDER — DOCUSATE SODIUM 100 MG PO CAPS: 100.0000 mg | ORAL_CAPSULE | Freq: Two times a day (BID) | ORAL | 0 refills | Status: DC

## 2016-09-06 NOTE — Telephone Encounter (Signed)
Add colace 100mg  bid (available OTC). Increase water, fresh fruits/veggies and exercise as able. Follow up in office if this does not help.

## 2016-09-06 NOTE — Telephone Encounter (Signed)
Notified pt and she voices understanding. 

## 2016-09-06 NOTE — Telephone Encounter (Signed)
Follow up potassium looks good, continue kdur.

## 2016-09-06 NOTE — Telephone Encounter (Signed)
Pt came in for labs and had a question. Pt says that she has been experiencing constipation for a while. Pt says that she is taking pain meds and also iron medication. Pt would like to know if the provider has any suggestions on something OTC that she can take that would help? Pt says that she has had it for a while but has never said anything to her PCP.    Pt says that she doesn't have insurance so she's trying to avoid a office visit if possible.    I informed  Pt that someone will follow up with her.    CB: [phone]

## 2016-11-19 ENCOUNTER — Other Ambulatory Visit: Payer: Self-pay | Admitting: Family

## 2016-11-19 NOTE — Telephone Encounter (Signed)
eScribe request from [facility] for refill on Amlodipine; Lovastatin, and Losartan-HCTZ Last filled - All: 08/26/16, #90x1 Denied - Last Rx to pharmacy 08/26/16, #90 with 1 Refill-Too Soon for request/SLS 05/29

## 2017-02-15 ENCOUNTER — Other Ambulatory Visit: Payer: Self-pay | Admitting: Family

## 2017-02-18 MED ORDER — POTASSIUM CHLORIDE ER 10 MEQ PO TBCR: EXTENDED_RELEASE_TABLET | ORAL | 1 refills | Status: DC

## 2017-02-18 NOTE — Addendum Note (Signed)
Addended by: Mervin Kung A on: 02/18/2017 07:07 AM   Modules accepted: Orders

## 2017-02-18 NOTE — Telephone Encounter (Signed)
Received fax from pharmacy requesting 90 days supply. Rx re-sent.

## 2017-04-28 ENCOUNTER — Other Ambulatory Visit: Payer: Self-pay | Admitting: Internal Medicine

## 2017-04-28 ENCOUNTER — Other Ambulatory Visit: Payer: Self-pay | Admitting: Family

## 2017-04-28 DIAGNOSIS — Z1239 Encounter for other screening for malignant neoplasm of breast: Secondary | ICD-10-CM

## 2017-05-07 ENCOUNTER — Ambulatory Visit (INDEPENDENT_AMBULATORY_CARE_PROVIDER_SITE_OTHER): Payer: Self-pay

## 2017-05-07 DIAGNOSIS — Z1231 Encounter for screening mammogram for malignant neoplasm of breast: Secondary | ICD-10-CM

## 2017-05-07 DIAGNOSIS — Z1239 Encounter for other screening for malignant neoplasm of breast: Secondary | ICD-10-CM

## 2017-05-21 ENCOUNTER — Other Ambulatory Visit: Payer: Self-pay | Admitting: Family

## 2017-05-26 ENCOUNTER — Encounter: Payer: Self-pay | Admitting: Family

## 2017-05-26 ENCOUNTER — Ambulatory Visit (INDEPENDENT_AMBULATORY_CARE_PROVIDER_SITE_OTHER): Payer: Self-pay | Admitting: Family

## 2017-05-26 VITALS — BP 128/60 | HR 100 | Temp 98.3°F | Resp 18 | Ht 61.0 in | Wt 207.0 lb

## 2017-05-26 DIAGNOSIS — R739 Hyperglycemia, unspecified: Secondary | ICD-10-CM

## 2017-05-26 DIAGNOSIS — I10 Essential (primary) hypertension: Secondary | ICD-10-CM

## 2017-05-26 DIAGNOSIS — D509 Iron deficiency anemia, unspecified: Secondary | ICD-10-CM

## 2017-05-26 DIAGNOSIS — E785 Hyperlipidemia, unspecified: Secondary | ICD-10-CM

## 2017-05-26 LAB — CBC WITH DIFFERENTIAL/PLATELET
BASOS ABS: 0 10*3/uL (ref 0.0–0.1)
Basophils Relative: 0.4 % (ref 0.0–3.0)
EOS PCT: 1.6 % (ref 0.0–5.0)
Eosinophils Absolute: 0.1 10*3/uL (ref 0.0–0.7)
HCT: 41.9 % (ref 36.0–46.0)
Hemoglobin: 13.3 g/dL (ref 12.0–15.0)
LYMPHS ABS: 2.8 10*3/uL (ref 0.7–4.0)
Lymphocytes Relative: 43.1 % (ref 12.0–46.0)
MCHC: 31.7 g/dL (ref 30.0–36.0)
MCV: 89.1 fl (ref 78.0–100.0)
MONO ABS: 0.5 10*3/uL (ref 0.1–1.0)
Monocytes Relative: 8 % (ref 3.0–12.0)
NEUTROS ABS: 3 10*3/uL (ref 1.4–7.7)
NEUTROS PCT: 46.9 % (ref 43.0–77.0)
PLATELETS: 338 10*3/uL (ref 150.0–400.0)
RBC: 4.7 Mil/uL (ref 3.87–5.11)
RDW: 14.3 % (ref 11.5–15.5)
WBC: 6.5 10*3/uL (ref 4.0–10.5)

## 2017-05-26 LAB — HEMOGLOBIN A1C: Hgb A1c MFr Bld: 6 % (ref 4.6–6.5)

## 2017-05-26 NOTE — Patient Instructions (Signed)
Please complete lab work prior to leaving.   

## 2017-05-26 NOTE — Progress Notes (Signed)
Subjective:    Patient ID: Regina Turner, female    DOB: December 25, 1955, 61 y.o.   MRN: [id number]  HPI   Regina Turner is a 61 yr old female who presents today for follow up.  HTN-reports that she had some "chips last night."  Continues losartan hctz.   BP Readings from Last 3 Encounters:  05/26/17 128/60  08/26/16 124/61  12/14/15 118/80   2) Hyperlipidemia- Maintained on mevacor.  Lab Results  Component Value Date   CHOL 152 06/09/2015   HDL 49.80 06/09/2015   LDLCALC 82 06/09/2015   TRIG 101.0 06/09/2015   CHOLHDL 3 06/09/2015   3) Anemia- she continues daily iron.  Lab Results  Component Value Date   WBC 6.5 08/26/2016   HGB 12.8 08/26/2016   HCT 39.0 08/26/2016   MCV 86.7 08/26/2016   PLT 316.0 08/26/2016   4) Hyperglycemia-  Lab Results  Component Value Date   HGBA1C 6.2 08/26/2016       Review of Systems    see HPI  Past Medical History:  Diagnosis Date  . Breast cyst   . Chicken pox   . Cyst of ovary   . Hyperlipidemia   . Hypertension      Social History   Socioeconomic History  . Marital status: Divorced    Spouse name: Not on file  . Number of children: Not on file  . Years of education: Not on file  . Highest education level: Not on file  Social Needs  . Financial resource strain: Not on file  . Food insecurity - worry: Not on file  . Food insecurity - inability: Not on file  . Transportation needs - medical: Not on file  . Transportation needs - non-medical: Not on file  Occupational History  . Not on file  Tobacco Use  . Smoking status: Never Smoker  . Smokeless tobacco: Never Used  Substance and Sexual Activity  . Alcohol use: Not on file  . Drug use: Not on file  . Sexual activity: Not on file  Other Topics Concern  . Not on file  Social History Narrative   Divorced   Has worked in school system- quit in 2013   Lives with daughter, has another daughter who lives in [place]   Completed 12th grade   Enjoys  reading    Past Surgical History:  Procedure Laterality Date  . ABDOMINAL HYSTERECTOMY  1985  . BREAST CYST EXCISION Left   . OVARIAN CYST SURGERY      Family History  Problem Relation Age of Onset  . Kidney disease Mother 8468       Deceased  . Arthritis Mother   . Stroke Mother   . Hypertension Mother   . Emphysema Father        Deceased 4770s  . Heart attack Father   . Cancer Maternal Grandfather   . Alzheimer's disease Maternal Grandmother   . Cancer Paternal Uncle   . Parkinson's disease Sister   . Diabetes Sister   . Stroke Brother   . Hypertension Brother        x2  . Lupus Daughter   . Migraines Daughter   . Hypertension Daughter        x2    Allergies  Allergen Reactions  . Sulfa Antibiotics Nausea And Vomiting    Current Outpatient Medications on File Prior to Visit  Medication Sig Dispense Refill  . amLODipine (NORVASC) 10 MG tablet TAKE ONE TABLET BY MOUTH AT  BEDTIME 90 tablet 1  . aspirin EC 81 MG tablet Take 81 mg by mouth daily.    . calcium carbonate (TUMS) 500 MG chewable tablet Chew 1 tablet by mouth as needed for indigestion or heartburn.    . docusate sodium (COLACE) 100 MG capsule Take 1 capsule (100 mg total) by mouth 2 (two) times daily. 10 capsule 0  . Ferrous Sulfate (IRON) 325 (65 FE) MG TABS Take 1 tablet by mouth daily. OTC    . losartan-hydrochlorothiazide (HYZAAR) 100-25 MG tablet TAKE ONE TABLET BY MOUTH ONE TIME DAILY 90 tablet 1  . lovastatin (MEVACOR) 20 MG tablet TAKE ONE TABLET BY MOUTH ONE TIME DAILY 90 tablet 1  . Multiple Vitamins-Minerals (CENTRUM SILVER ADULT 50+ PO) Take 1 tablet by mouth daily.    . potassium chloride (K-DUR) 10 MEQ tablet TAKE TWO TABLETS BY MOUTH ONE TIME DAILY 180 tablet 1   No current facility-administered medications on file prior to visit.     BP 128/60   Pulse 100   Temp 98.3 F (36.8 C) (Oral)   Resp 18   Ht 5\' 1"  (1.549 m)   Wt 207 lb (93.9 kg)   SpO2 100%   BMI 39.11 kg/m    Objective:     Physical Exam  Constitutional: She is oriented to person, place, and time. She appears well-developed and well-nourished.  Cardiovascular: Normal rate, regular rhythm and normal heart sounds.  No murmur heard. Pulmonary/Chest: Effort normal and breath sounds normal. No respiratory distress. She has no wheezes.  Musculoskeletal: She exhibits no edema.  Neurological: She is alert and oriented to person, place, and time.  Psychiatric: She has a normal mood and affect. Her behavior is normal. Judgment and thought content normal.          Assessment & Plan:  HTN- repeat bp is improved.  Hyperlipidemia- obtain follow up lipid panel.   Anemia- tolerating iron. Obtain follow up serum iron and cbc.   Hyperglycemia- obtain follow up a1c. Declines flu shot.   We discussed using my fitness pal to lose weight.

## 2017-05-27 ENCOUNTER — Other Ambulatory Visit: Payer: Self-pay | Admitting: *Deleted

## 2017-05-27 ENCOUNTER — Other Ambulatory Visit: Payer: Self-pay | Admitting: Family

## 2017-05-27 LAB — COMPREHENSIVE METABOLIC PANEL
ALBUMIN: 4.7 g/dL (ref 3.5–5.2)
ALT: 12 U/L (ref 0–35)
AST: 17 U/L (ref 0–37)
Alkaline Phosphatase: 53 U/L (ref 39–117)
BUN: 15 mg/dL (ref 6–23)
CALCIUM: 10.7 mg/dL — AB (ref 8.4–10.5)
CHLORIDE: 101 meq/L (ref 96–112)
CO2: 30 meq/L (ref 19–32)
CREATININE: 0.92 mg/dL (ref 0.40–1.20)
GFR: 79.51 mL/min (ref >60.00)
Glucose, Bld: 87 mg/dL (ref 70–99)
POTASSIUM: 4.2 meq/L (ref 3.5–5.1)
SODIUM: 140 meq/L (ref 135–145)
Total Bilirubin: 0.4 mg/dL (ref 0.2–1.2)
Total Protein: 7.7 g/dL (ref 6.0–8.3)

## 2017-05-27 LAB — LIPID PANEL
CHOL/HDL RATIO: 3
Cholesterol: 152 mg/dL (ref 0–200)
HDL: 57.1 mg/dL (ref >39.00)
LDL Cholesterol: 77 mg/dL (ref 0–99)
NONHDL: 94.55
TRIGLYCERIDES: 90 mg/dL (ref 0.0–149.0)
VLDL: 18 mg/dL (ref 0.0–40.0)

## 2017-05-27 LAB — IRON: Iron: 80 ug/dL (ref 42–145)

## 2017-06-10 ENCOUNTER — Other Ambulatory Visit (INDEPENDENT_AMBULATORY_CARE_PROVIDER_SITE_OTHER): Payer: Self-pay

## 2017-06-11 LAB — PTH, INTACT AND CALCIUM
Calcium: 10.8 mg/dL — ABNORMAL HIGH (ref 8.6–10.4)
PTH: 75 pg/mL — ABNORMAL HIGH (ref 14–64)

## 2017-06-12 ENCOUNTER — Telehealth: Payer: Self-pay | Admitting: Family

## 2017-06-12 DIAGNOSIS — E213 Hyperparathyroidism, unspecified: Secondary | ICD-10-CM

## 2017-06-12 NOTE — Telephone Encounter (Signed)
Results explained to pt, advised she will be referred to endocrinology. She was scheduled to come in 06-20-17 for vit d level.

## 2017-06-12 NOTE — Telephone Encounter (Signed)
Please contact patient and let her know that her calcium level is elevated.  Studies also show overactive parathyroid hormone.  I would like to refer her to endocrinology for further evaluation and also have her return to the lab to check a vitamin D level please.

## 2017-06-20 ENCOUNTER — Other Ambulatory Visit (INDEPENDENT_AMBULATORY_CARE_PROVIDER_SITE_OTHER): Payer: Self-pay

## 2017-06-20 DIAGNOSIS — E213 Hyperparathyroidism, unspecified: Secondary | ICD-10-CM

## 2017-06-20 LAB — VITAMIN D 25 HYDROXY (VIT D DEFICIENCY, FRACTURES): VITD: 38.7 ng/mL (ref 30.00–100.00)

## 2017-06-21 ENCOUNTER — Encounter: Payer: Self-pay | Admitting: Family

## 2017-06-25 NOTE — Progress Notes (Signed)
Letter mailed out to pt

## 2017-06-30 ENCOUNTER — Telehealth: Payer: Self-pay | Admitting: Family

## 2017-06-30 NOTE — Telephone Encounter (Signed)
Results printed and placed at front desk for pt to pick up. Notified pt.

## 2017-06-30 NOTE — Telephone Encounter (Signed)
Patient walked in and requested to have all of last months labs printed and picked up.

## 2017-08-19 ENCOUNTER — Other Ambulatory Visit: Payer: Self-pay | Admitting: Family

## 2017-09-01 ENCOUNTER — Encounter: Payer: Self-pay | Admitting: Internal Medicine

## 2017-09-01 ENCOUNTER — Ambulatory Visit: Payer: Self-pay | Admitting: Internal Medicine

## 2017-09-01 ENCOUNTER — Ambulatory Visit (INDEPENDENT_AMBULATORY_CARE_PROVIDER_SITE_OTHER): Payer: Self-pay | Admitting: Internal Medicine

## 2017-09-01 DIAGNOSIS — E213 Hyperparathyroidism, unspecified: Secondary | ICD-10-CM

## 2017-09-01 MED ORDER — FUROSEMIDE 20 MG PO TABS: 20.0000 mg | ORAL_TABLET | Freq: Every day | ORAL | 3 refills | Status: DC

## 2017-09-01 MED ORDER — LOSARTAN POTASSIUM 100 MG PO TABS: 100.0000 mg | ORAL_TABLET | Freq: Every day | ORAL | 11 refills | Status: AC

## 2017-09-01 NOTE — Progress Notes (Addendum)
Patient ID: Regina Turner, female   DOB: 05/20/1956, 62 y.o.   MRN: [id number]    HPI  Regina Turner is a 62 y.o.-year-old female, referred by her PCP, Sandford Craze, NP, for evaluation and management of hypercalcemia/hyperparathyroidism.  She is here with her daughter who offers part of the history, especially regarding her past medical history and medications.  Pt was dx with hypercalcemia at least since 2016. I reviewed pt's pertinent labs: Lab Results  Component Value Date   PTH 75 (H) 06/10/2017   PTH 56 06/15/2015   CALCIUM 10.8 (H) 06/10/2017   CALCIUM 10.7 (H) 05/26/2017   CALCIUM 10.5 09/06/2016   CALCIUM 10.3 08/26/2016   CALCIUM 10.5 12/14/2015   CALCIUM 10.2 06/15/2015   CALCIUM 10.8 (H) 06/09/2015   CALCIUM 10.0 01/05/2015   CALCIUM 10.0 12/29/2014   CALCIUM 10.6 (H) 12/15/2014   No available DXA scans to review. No fractures or falls.   No h/o kidney stones.  No h/o CKD. Last BUN/Cr: Lab Results  Component Value Date   BUN 15 05/26/2017   BUN 16 09/06/2016   CREATININE 0.92 05/26/2017   CREATININE 0.86 09/06/2016   Pt is on HCTZ 25 mg daily as a part of Hyzaar.  No h/o vitamin D deficiency. Reviewed vit D levels: Lab Results  Component Value Date   VD25OH 38.70 06/20/2017   Pt is on vitamin D - in MVI (ran out 2 weeks ago) and OTC 2000 IU daily - started 2 mo ago.  She was taking Tums for heartburn - 2-3x a week.  Pt does not have a FH of hypercalcemia, pituitary tumors, thyroid cancer, or osteoporosis.   Pt. also has a history of HTN, HL, GERD.  ROS: Constitutional: + Weight gain, no fatigue, + subjective hyperthermia, + poor sleep Eyes:+ blurry vision, no xerophthalmia ENT: no sore throat, no nodules palpated in throat, + dysphagia, + hoarseness, + decreased hearing, + tinnitus Cardiovascular: no CP/SOB/palpitations/leg swelling Respiratory: + cough/SOB Gastrointestinal: no N/V/D/C/+ heartburn Musculoskeletal: + muscle/no joint  aches Skin: no rashes Neurological: no tremors/numbness/tingling/dizziness Psychiatric: no depression/anxiety  Past Medical History:  Diagnosis Date  . Breast cyst   . Chicken pox   . Cyst of ovary   . Hyperlipidemia   . Hypertension    Past Surgical History:  Procedure Laterality Date  . ABDOMINAL HYSTERECTOMY  1985  . BREAST CYST EXCISION Left   . OVARIAN CYST SURGERY     Social History   Socioeconomic History  . Marital status: Divorced    Spouse name: Not on file  . Number of children: 2  Social Needs  Occupational History  .   Tobacco Use  . Smoking status: Never Smoker  . Smokeless tobacco: Never Used  Substance and Sexual Activity  . Alcohol use: No  . Drug use: No  Social History Narrative   Divorced   Has worked in school system- quit in 2013   Lives with daughter, has another daughter who lives in [place]   Completed 12th grade   Enjoys reading   Current Outpatient Medications on File Prior to Visit  Medication Sig Dispense Refill  . amLODipine (NORVASC) 10 MG tablet TAKE ONE TABLET BY MOUTH AT BEDTIME 90 tablet 1  . aspirin EC 81 MG tablet Take 81 mg by mouth daily.    . Ferrous Sulfate (IRON) 325 (65 FE) MG TABS Take 1 tablet by mouth daily. OTC    . losartan-hydrochlorothiazide (HYZAAR) 100-25 MG tablet TAKE ONE TABLET BY MOUTH ONE  TIME DAILY 90 tablet 1  . lovastatin (MEVACOR) 20 MG tablet TAKE ONE TABLET BY MOUTH ONE TIME DAILY 90 tablet 1  . Multiple Vitamins-Minerals (CENTRUM SILVER ADULT 50+ PO) Take 1 tablet by mouth daily.    . potassium chloride (K-DUR) 10 MEQ tablet TAKE TWO TABLETS BY MOUTH ONE TIME DAILY 180 tablet 1   No current facility-administered medications on file prior to visit.    Allergies  Allergen Reactions  . Sulfa Antibiotics Nausea And Vomiting   Family History  Problem Relation Age of Onset  . Kidney disease Mother [id number]       Deceased  . Arthritis Mother   . Stroke Mother   . Hypertension Mother   . Emphysema  Father        Deceased 1870s  . Heart attack Father   . Cancer Maternal Grandfather   . Alzheimer's disease Maternal Grandmother   . Cancer Paternal Uncle   . Parkinson's disease Sister   . Diabetes Sister   . Stroke Brother   . Hypertension Brother        x2  . Lupus Daughter   . Migraines Daughter   . Hypertension Daughter        x2    PE: BP 124/68 (BP Location: Left Arm, Patient Position: Sitting, Cuff Size: Large)   Pulse 78   Ht 5\' 1"  (1.549 m)   Wt 207 lb (93.9 kg)   SpO2 99%   BMI 39.11 kg/m  Wt Readings from Last 3 Encounters:  09/01/17 207 lb (93.9 kg)  05/26/17 207 lb (93.9 kg)  08/26/16 205 lb 3.2 oz (93.1 kg)   Constitutional: overweight, in NAD. No kyphosis. Eyes: PERRLA, EOMI, no exophthalmos ENT: moist mucous membranes, no thyromegaly, no cervical lymphadenopathy Cardiovascular: RRR, No MRG Respiratory: CTA B Gastrointestinal: abdomen soft, NT, ND, BS+ Musculoskeletal: no deformities, strength intact in all 4 Skin: moist, warm, no rashes Neurological: no tremor with outstretched hands, DTR normal in all 4  Assessment: 1. Hypercalcemia/hyperparathyroidism  Plan: Patient has had several instances of elevated calcium, with the highest level being at 10.8.  A corresponding intact PTH level was also high, at 75.  - Patient does not have a known history of vitamin D deficiency,  with the last level being 38.7.  - No apparent complications from hypercalcemia: no h/o nephrolithiasis, no osteoporosis, no fractures. No abdominal pain, depression, bone pain. - I discussed with the patient and her daughter about the physiology of calcium and parathyroid hormone, and possible side effects from increased PTH, including kidney stones, osteoporosis, abdominal pain, etc.  - We discussed that we need to check whether her hyperparathyroidism is primary (Familial hypercalcemic hypocalciuria or parathyroid adenoma) or secondary (to conditions like: vitamin D deficiency,  calcium malabsorption, hypercalciuria, renal insufficiency, etc.). - I discussed with her that we first need to stop her HCTZ so we can further investigate the parathyroid status. I explained that HCTZ can elevate serum calcium. However, if the PTH is elevated off HCTZ and in the setting of a normal vitamin D, we will further need to investigate her for primary or secondary hyperparathyroidism. - 3-4 weeks after we stop HCTZ, we'll need to check: calcium level intact PTH ([facility]) Magnesium Phosphorus vitamin D- 25 HO and 1,25 HO 24h urinary calcium/creatinine ratio -will decide if she needs this after the above results return - We discussed possible consequences of hyperparathyroidism: ~1/3 pts will develop complications over 15 years (OP, nephrolithiasis).  - If the tests indicate a  parathyroid adenoma, she agrees with a referral to surgery.  - criteria for parathyroid surgery:  Increased calcium by more than 1 mg/dL above the upper limit of normal  Kidney ds.  Osteoporosis (or Vb fx) Age <44 years old Newer criteria (2013): High UCa >400 mg/d and increased stone risk by biochemical stone risk analysis Presence of nephrolithiasis or nephrocalcinosis Pt's preference!  - If the labs are abnormal, we will need to check a DXA scan to see if she has osteoporosis (+ add a 33% distal radius for evaluation of cortical bone, which is predominantly affected by hyperparathyroidism).  - I will advise her about vitamin D supplement dose when the results of the vitamin D level are back.  For now, continue 2000 units daily - I will see the patient back in 6 months  Component     Latest Ref Rng & Units 09/29/2017  Glucose     65 - 99 mg/dL 89  BUN     7 - 25 mg/dL 16  Creatinine     1.61 - 0.99 mg/dL 0.96  GFR, Est Non African American     > OR = 60 mL/min/1.47m2 68  GFR, Est African American     > OR = 60 mL/min/1.47m2 78  Sodium     135 - 146 mmol/L 139  Potassium     3.5 - 5.3 mmol/L 4.5   Chloride     98 - 110 mmol/L 102  CO2     20 - 32 mmol/L 29  Calcium     8.6 - 10.4 mg/dL 04.5  Vitamin D 1, 25 (OH) Total     18 - 72 pg/mL 50  Vitamin D3 1, 25 (OH)     pg/mL 50  Vitamin D2 1, 25 (OH)     pg/mL <8  PTH, Intact     15 - 65 pg/mL 65  VITD     30.00 - 100.00 ng/mL 42.99  Magnesium     1.5 - 2.5 mg/dL 1.9  Phosphorus     2.3 - 4.6 mg/dL 3.3   Labs are all normal after stopping HCTZ. No intervention needed for now >> will recheck Ca + PTH in 6 mo.  Carlus Pavlov, MD PhD [facility] Endocr[facility]

## 2017-09-01 NOTE — Patient Instructions (Addendum)
Please stop Hyzaar and start: - Furosemide (Lasix) 20 mg daily - Losartan 100 mg daily  Please come in for labs in 3-4 weeks.  Please come back for a follow-up appointment in 6 months.  Hypercalcemia Hypercalcemia is having too much calcium in the blood. The body needs calcium to make bones and keep them strong. Calcium also helps the muscles, nerves, brain, and heart work the way they should. Most of the calcium in the body is in the bones. There is also some calcium in the blood. Hypercalcemia can happen when calcium comes out of the bones, or when the kidneys are not able to remove calcium from the blood. Hypercalcemia can be mild or severe. What are the causes? There are many possible causes of hypercalcemia. Common causes include:  Hyperparathyroidism. This is a condition in which the body produces too much parathyroid hormone. There are four parathyroid glands in your neck. These glands produce a chemical messenger (hormone) that helps the body absorb calcium from foods and helps your bones release calcium.  Certain kinds of cancer, such as lung cancer, breast cancer, or myeloma.  Less common causes of hypercalcemia include:  Getting too much calcium or vitamin D from your diet.  Kidney failure.  Hyperthyroidism.  Being on bed rest for a long time.  Certain medicines.  Infections.  Sarcoidosis.  What increases the risk? This condition is more likely to develop in:  Women.  People who are 60 years or older.  People who have a family history of hypercalcemia.  What are the signs or symptoms? Mild hypercalcemia that starts slowly may not cause symptoms. Severe, sudden hypercalcemia is more likely to cause symptoms, such as:  Loss of appetite.  Increased thirst and frequent urination.  Fatigue.  Nausea and vomiting.  Headache.  Abdominal pain.  Muscle pain, twitching, or weakness.  Constipation.  Blood in the urine.  Pain in the side of the back  (flank pain).  Anxiety, confusion, or depression.  Irregular heartbeat (arrhythmia).  Loss of consciousness.  How is this diagnosed? This condition may be diagnosed based on:  Your symptoms.  Blood tests.  Urine tests.  X-rays.  Ultrasound.  MRI.  CT scan.  How is this treated? Treatment for hypercalcemia depends on the cause. Treatment may include:  Receiving fluids through an IV tube.  Medicines that keep calcium levels steady after receiving fluids (loop diuretics).  Medicines that keep calcium in your bones (bisphosphonates).  Medicines that lower the calcium level in your blood.  Surgery to remove overactive parathyroid glands.  Follow these instructions at home:  Take over-the-counter and prescription medicines only as told by your health care provider.  Follow instructions from your health care provider about eating or drinking restrictions.  Drink enough fluid to keep your urine clear or pale yellow.  Stay active. Weight-bearing exercise helps to keep calcium in your bones. Follow instructions from your health care provider about what type and level of exercise is safe for you.  Keep all follow-up visits as told by your health care provider. This is important. Contact a health care provider if:  You have a fever.  You have flank or abdominal pain that is getting worse. Get help right away if:  You have severe abdominal or flank pain.  You have chest pain.  You have trouble breathing.  You become very confused and sleepy.  You lose consciousness. This information is not intended to replace advice given to you by your health care provider. Make  sure you discuss any questions you have with your health care provider. Document Released: 08/24/2004 Document Revised: 11/16/2015 Document Reviewed: 10/26/2014 Elsevier Interactive Patient Education  2018 [employer]

## 2017-09-29 ENCOUNTER — Other Ambulatory Visit: Payer: Self-pay

## 2017-09-29 DIAGNOSIS — E213 Hyperparathyroidism, unspecified: Secondary | ICD-10-CM

## 2017-09-29 LAB — VITAMIN D 25 HYDROXY (VIT D DEFICIENCY, FRACTURES): VITD: 42.99 ng/mL (ref 30.00–100.00)

## 2017-09-29 LAB — MAGNESIUM: Magnesium: 1.9 mg/dL (ref 1.5–2.5)

## 2017-09-29 LAB — PHOSPHORUS: PHOSPHORUS: 3.3 mg/dL (ref 2.3–4.6)

## 2017-09-30 LAB — PARATHYROID HORMONE, INTACT (NO CA): PTH: 65 pg/mL (ref 15–65)

## 2017-10-02 LAB — BASIC METABOLIC PANEL WITH GFR
BUN: 16 mg/dL (ref 7–25)
CALCIUM: 10.4 mg/dL (ref 8.6–10.4)
CHLORIDE: 102 mmol/L (ref 98–110)
CO2: 29 mmol/L (ref 20–32)
Creat: 0.91 mg/dL (ref 0.50–0.99)
GFR, Est African American: 78 mL/min/{1.73_m2} (ref >=60)
GFR, Est Non African American: 68 mL/min/{1.73_m2} (ref >=60)
Glucose, Bld: 89 mg/dL (ref 65–99)
POTASSIUM: 4.5 mmol/L (ref 3.5–5.3)
SODIUM: 139 mmol/L (ref 135–146)

## 2017-10-02 LAB — VITAMIN D 1,25 DIHYDROXY
VITAMIN D 1, 25 (OH) TOTAL: 50 pg/mL (ref 18–72)
VITAMIN D3 1, 25 (OH): 50 pg/mL
Vitamin D2 1, 25 (OH)2: <8 pg/mL

## 2017-11-17 ENCOUNTER — Telehealth: Payer: Self-pay | Admitting: Family

## 2017-11-18 ENCOUNTER — Other Ambulatory Visit: Payer: Self-pay | Admitting: Internal Medicine

## 2017-11-18 NOTE — Telephone Encounter (Signed)
Amlodipine and Hyzaar refill sent to pharmacy. Pt is due 11/24/17 for 6 month follow up. Please call pt to schedule appt with Melissa. Thanks!

## 2017-11-18 NOTE — Telephone Encounter (Signed)
Called and left a message with the Pt's daughter. Advised that a 1 month supply of medication was sent, but her mother would need an office visit before any further refills could be sent. Pt's daughter said that she would let her mother know and would have her to call back and schedule an appt.

## 2017-12-16 ENCOUNTER — Telehealth: Payer: Self-pay | Admitting: Internal Medicine

## 2017-12-16 NOTE — Telephone Encounter (Signed)
Patient stated that the medication furosemide (LASIX) 20 MG tablet [[id number] Is giving her muscle cramps.  Please advise

## 2017-12-17 NOTE — Telephone Encounter (Signed)
She can stop the furosemide but needs to talk to her PCP also

## 2017-12-17 NOTE — Telephone Encounter (Signed)
Please ask the patient to schedule an OV with me.

## 2017-12-19 NOTE — Telephone Encounter (Signed)
Notified pt's daughter. Pt is still sleeping. She will discuss with pt when she wakes up and will call us back to schedule an appt. [facility] to discuss with pt and schedule follow up with Sandford CrazeMelissa O'Sullivan, NP.

## 2018-02-08 ENCOUNTER — Other Ambulatory Visit: Payer: Self-pay | Admitting: Family

## 2018-02-09 NOTE — Telephone Encounter (Signed)
Potassium refill denied. Pt was due for 6 month f/u in June and is past due. 30 day supply was given previously and message was left w/pt's daughter to have pt call and schedule an appt for further refills.

## 2018-02-25 ENCOUNTER — Other Ambulatory Visit: Payer: Self-pay | Admitting: Internal Medicine

## 2018-03-04 ENCOUNTER — Ambulatory Visit: Payer: Self-pay | Admitting: Internal Medicine

## 2018-03-10 ENCOUNTER — Other Ambulatory Visit: Payer: Self-pay | Admitting: Family

## 2018-03-11 NOTE — Telephone Encounter (Signed)
30 day supply of potassium sent to pharmacy and letter mailed to pt as she is past due for OV and will need to be seen before further refills can be given.

## 2018-06-11 ENCOUNTER — Other Ambulatory Visit: Payer: Self-pay | Admitting: Family Medicine

## 2018-06-11 DIAGNOSIS — Z0000 Encounter for general adult medical examination without abnormal findings: Secondary | ICD-10-CM

## 2018-07-02 ENCOUNTER — Ambulatory Visit (INDEPENDENT_AMBULATORY_CARE_PROVIDER_SITE_OTHER): Payer: Self-pay

## 2018-07-02 DIAGNOSIS — Z1231 Encounter for screening mammogram for malignant neoplasm of breast: Secondary | ICD-10-CM

## 2018-07-02 DIAGNOSIS — Z0000 Encounter for general adult medical examination without abnormal findings: Secondary | ICD-10-CM

## 2019-05-05 ENCOUNTER — Other Ambulatory Visit: Payer: Self-pay | Admitting: Family Medicine

## 2019-05-05 DIAGNOSIS — Z1231 Encounter for screening mammogram for malignant neoplasm of breast: Secondary | ICD-10-CM

## 2019-05-07 IMAGING — MG DIGITAL SCREENING BILATERAL MAMMOGRAM WITH CAD
8 series · 8 of 8 positions shown · non-contrast
Comparison: Previous exam(s).

CLINICAL DATA: Screening.

EXAM:
DIGITAL SCREENING BILATERAL MAMMOGRAM WITH CAD

[L MLO (1 of 2)]
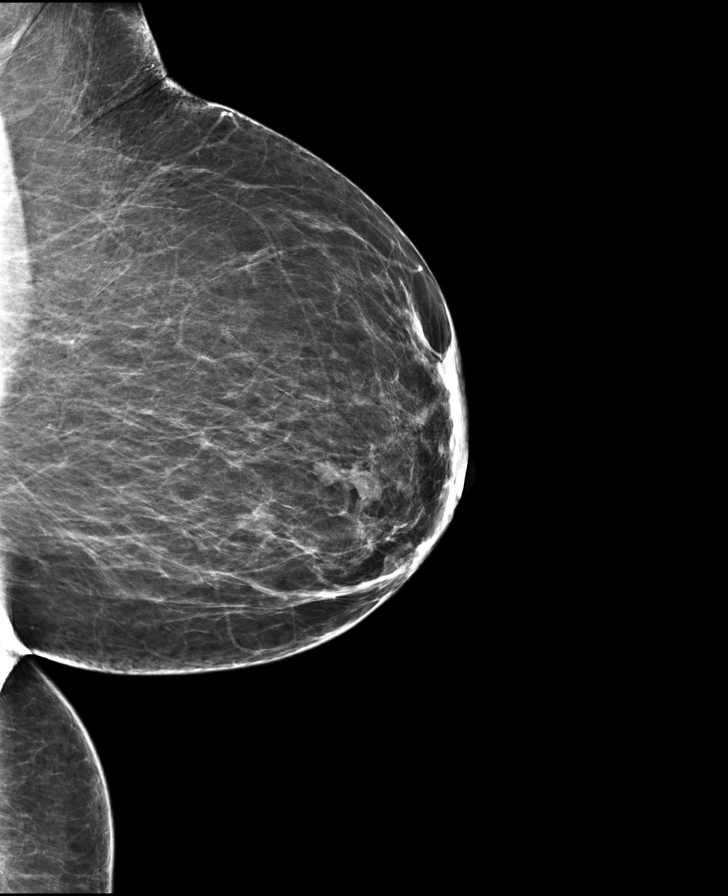

[L MLO (2 of 2)]
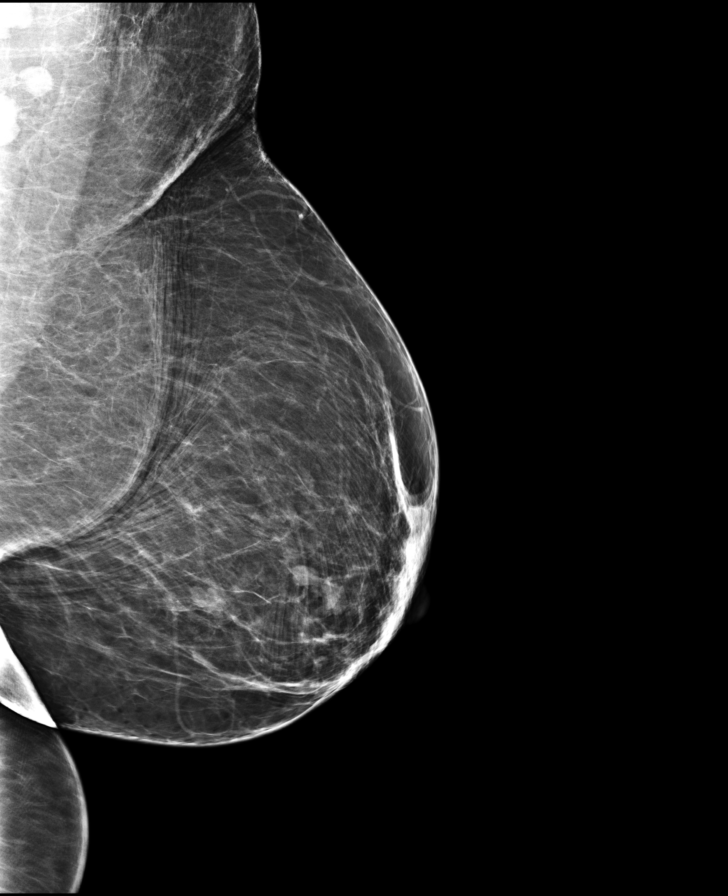

[R MLO (1 of 2)]
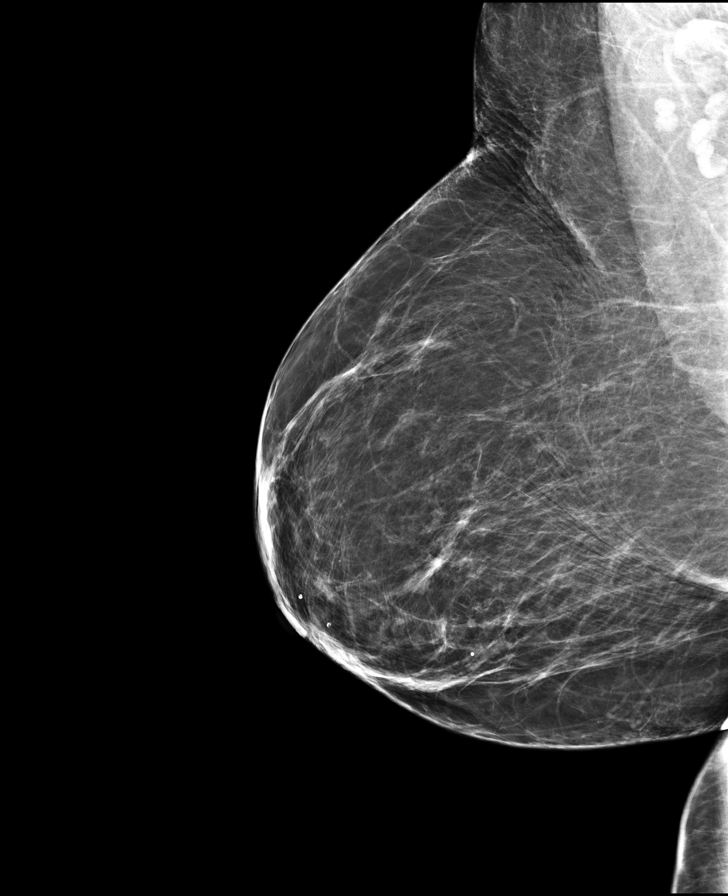

[R CC]
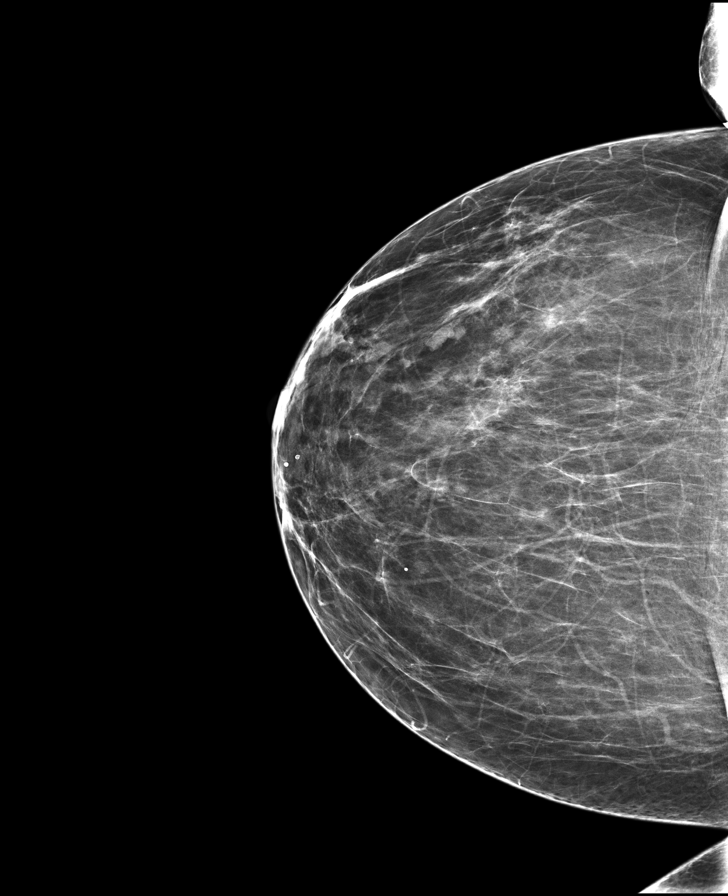

[R MLO (2 of 2)]
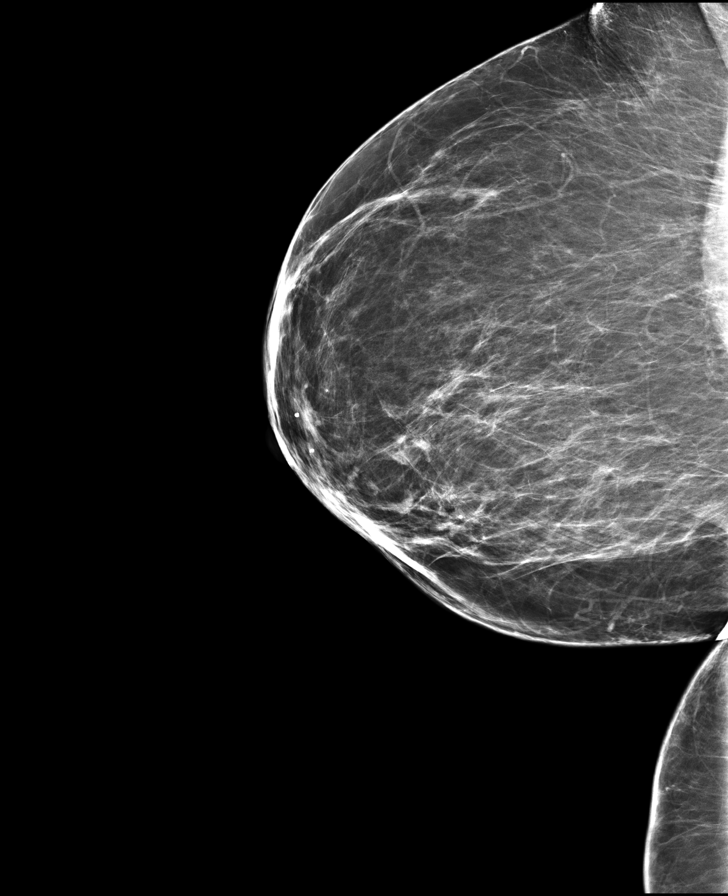

[R XCCL]
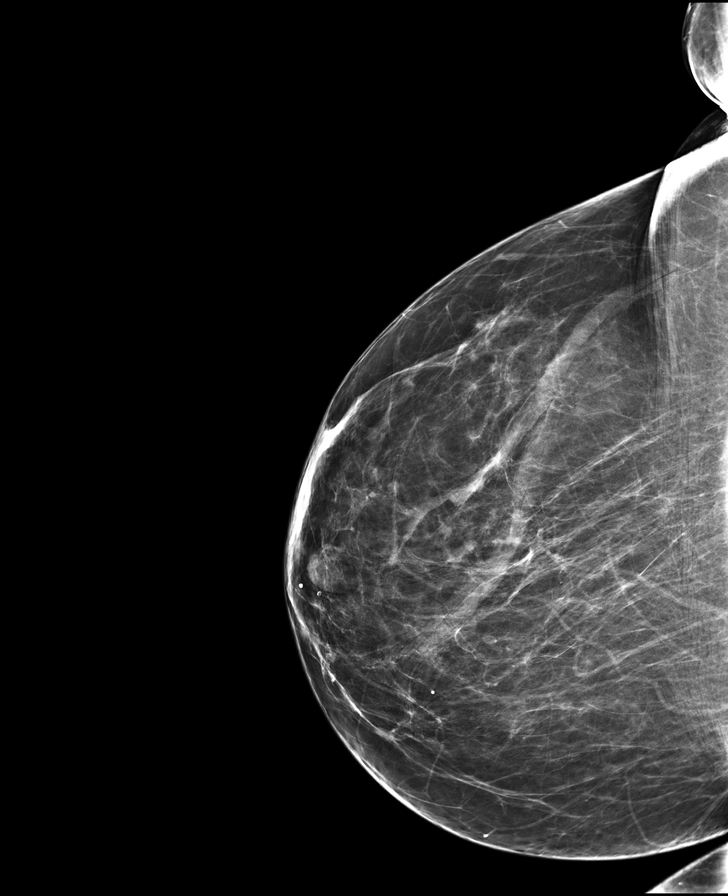

[L CC]
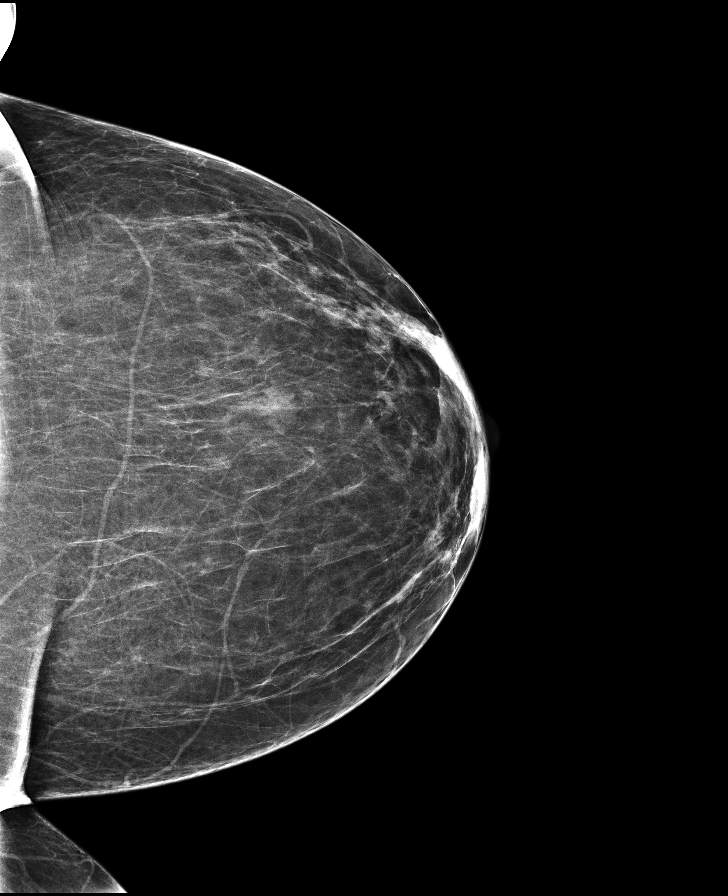

[L XCCL]
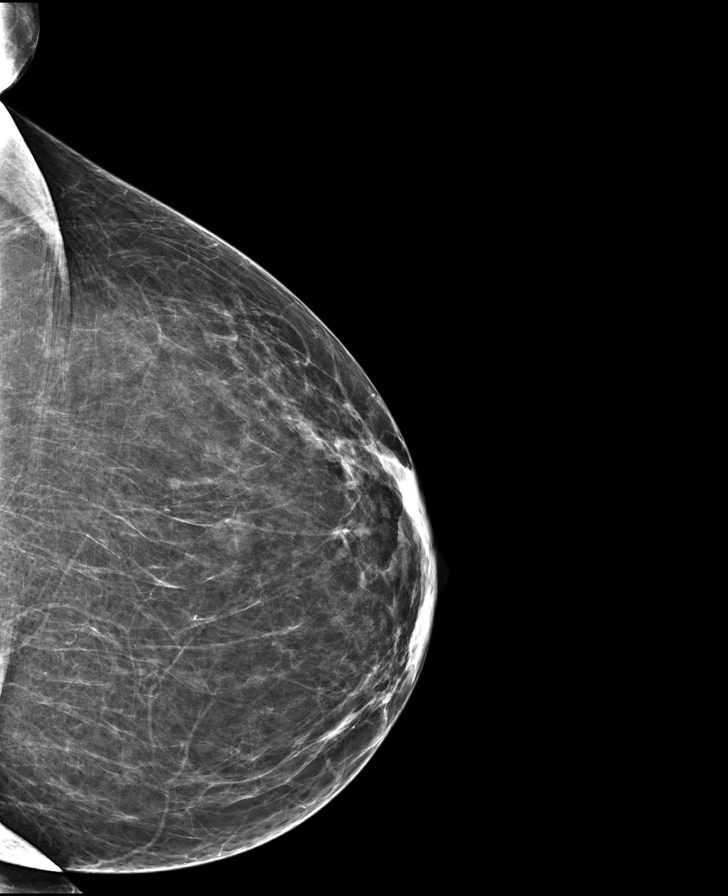

[8 of 8 positions shown; findings below may reference images not displayed]

ACR Breast Density Category b: There are scattered areas of
fibroglandular density.
FINDINGS: There are no findings suspicious for malignancy. Images were
processed with CAD.
IMPRESSION: No mammographic evidence of malignancy. A result letter of this
screening mammogram will be mailed directly to the patient.

RECOMMENDATION:
Screening mammogram in one year. (Code:AS-G-LCT)

BI-RADS CATEGORY  1: Negative.

## 2019-07-08 ENCOUNTER — Other Ambulatory Visit: Payer: Self-pay

## 2019-07-08 ENCOUNTER — Ambulatory Visit (INDEPENDENT_AMBULATORY_CARE_PROVIDER_SITE_OTHER): Payer: Self-pay

## 2019-07-08 ENCOUNTER — Ambulatory Visit: Payer: Self-pay

## 2019-07-08 DIAGNOSIS — Z1231 Encounter for screening mammogram for malignant neoplasm of breast: Secondary | ICD-10-CM

## 2019-07-22 ENCOUNTER — Ambulatory Visit: Payer: Self-pay

## 2020-06-12 ENCOUNTER — Other Ambulatory Visit: Payer: Self-pay | Admitting: Family

## 2020-06-12 ENCOUNTER — Other Ambulatory Visit: Payer: Self-pay | Admitting: Family Medicine

## 2020-06-12 DIAGNOSIS — Z1231 Encounter for screening mammogram for malignant neoplasm of breast: Secondary | ICD-10-CM

## 2020-08-03 ENCOUNTER — Ambulatory Visit (INDEPENDENT_AMBULATORY_CARE_PROVIDER_SITE_OTHER): Payer: Medicare Other

## 2020-08-03 ENCOUNTER — Other Ambulatory Visit: Payer: Self-pay

## 2020-08-03 DIAGNOSIS — Z1231 Encounter for screening mammogram for malignant neoplasm of breast: Secondary | ICD-10-CM | POA: Diagnosis not present

## 2021-07-10 ENCOUNTER — Other Ambulatory Visit: Payer: Self-pay | Admitting: Family Medicine

## 2021-07-10 DIAGNOSIS — Z1231 Encounter for screening mammogram for malignant neoplasm of breast: Secondary | ICD-10-CM

## 2021-08-09 ENCOUNTER — Other Ambulatory Visit: Payer: Self-pay

## 2021-08-09 ENCOUNTER — Ambulatory Visit (INDEPENDENT_AMBULATORY_CARE_PROVIDER_SITE_OTHER): Payer: 59

## 2021-08-09 DIAGNOSIS — Z1231 Encounter for screening mammogram for malignant neoplasm of breast: Secondary | ICD-10-CM

## 2022-07-15 ENCOUNTER — Encounter: Payer: Self-pay | Admitting: Physician Assistant

## 2022-07-16 ENCOUNTER — Other Ambulatory Visit: Payer: Self-pay | Admitting: Family Medicine

## 2022-07-16 DIAGNOSIS — Z1231 Encounter for screening mammogram for malignant neoplasm of breast: Secondary | ICD-10-CM

## 2022-08-14 ENCOUNTER — Ambulatory Visit (INDEPENDENT_AMBULATORY_CARE_PROVIDER_SITE_OTHER): Payer: 59

## 2022-08-14 DIAGNOSIS — Z1231 Encounter for screening mammogram for malignant neoplasm of breast: Secondary | ICD-10-CM | POA: Diagnosis not present

## 2023-07-30 ENCOUNTER — Other Ambulatory Visit: Payer: Self-pay | Admitting: Family Medicine

## 2023-07-30 DIAGNOSIS — Z1231 Encounter for screening mammogram for malignant neoplasm of breast: Secondary | ICD-10-CM

## 2023-09-24 ENCOUNTER — Ambulatory Visit: Payer: 59

## 2023-09-24 DIAGNOSIS — Z1231 Encounter for screening mammogram for malignant neoplasm of breast: Secondary | ICD-10-CM | POA: Diagnosis not present
